# Patient Record
Sex: Male | Born: 1945
Health system: Southern US, Community
[De-identification: ages and names within clinical notes are randomized; demographics above are authoritative.]

## PROBLEM LIST (undated history)

## (undated) DIAGNOSIS — M5030 Other cervical disc degeneration, unspecified cervical region: Secondary | ICD-10-CM

## (undated) DIAGNOSIS — G4733 Obstructive sleep apnea (adult) (pediatric): Secondary | ICD-10-CM

## (undated) DIAGNOSIS — I10 Essential (primary) hypertension: Secondary | ICD-10-CM

## (undated) DIAGNOSIS — E785 Hyperlipidemia, unspecified: Secondary | ICD-10-CM

## (undated) DIAGNOSIS — E119 Type 2 diabetes mellitus without complications: Secondary | ICD-10-CM

## (undated) DIAGNOSIS — Z972 Presence of dental prosthetic device (complete) (partial): Secondary | ICD-10-CM

## (undated) DIAGNOSIS — N3941 Urge incontinence: Secondary | ICD-10-CM

## (undated) DIAGNOSIS — N429 Disorder of prostate, unspecified: Secondary | ICD-10-CM

## (undated) DIAGNOSIS — G4730 Sleep apnea, unspecified: Secondary | ICD-10-CM

## (undated) DIAGNOSIS — N138 Other obstructive and reflux uropathy: Secondary | ICD-10-CM

## (undated) DIAGNOSIS — N529 Male erectile dysfunction, unspecified: Secondary | ICD-10-CM

## (undated) DIAGNOSIS — N183 Chronic kidney disease, stage 3 unspecified: Secondary | ICD-10-CM

## (undated) DIAGNOSIS — I639 Cerebral infarction, unspecified: Secondary | ICD-10-CM

## (undated) DIAGNOSIS — K219 Gastro-esophageal reflux disease without esophagitis: Secondary | ICD-10-CM

## (undated) DIAGNOSIS — Z973 Presence of spectacles and contact lenses: Secondary | ICD-10-CM

## (undated) DIAGNOSIS — N401 Enlarged prostate with lower urinary tract symptoms: Secondary | ICD-10-CM

## (undated) DIAGNOSIS — Z794 Long term (current) use of insulin: Secondary | ICD-10-CM

## (undated) DIAGNOSIS — R972 Elevated prostate specific antigen [PSA]: Secondary | ICD-10-CM

## (undated) HISTORY — DX: Elevated prostate specific antigen (PSA): R97.20

## (undated) HISTORY — PX: SOFT TISSUE MASS EXCISION: SHX2419

## (undated) HISTORY — PX: PROSTATE BIOPSY: SHX241

## (undated) HISTORY — DX: Cerebral infarction, unspecified: I63.9

---

## 2003-04-20 ENCOUNTER — Emergency Department (HOSPITAL_COMMUNITY): Admission: EM | Admit: 2003-04-20 | Discharge: 2003-04-20 | Payer: Self-pay | Admitting: Emergency Medicine

## 2003-04-20 ENCOUNTER — Encounter: Payer: Self-pay | Admitting: Emergency Medicine

## 2006-04-15 ENCOUNTER — Encounter: Admission: RE | Admit: 2006-04-15 | Discharge: 2006-04-15 | Payer: Self-pay | Admitting: Internal Medicine

## 2010-04-07 ENCOUNTER — Emergency Department (HOSPITAL_COMMUNITY): Admission: EM | Admit: 2010-04-07 | Discharge: 2010-04-07 | Payer: Self-pay | Admitting: Emergency Medicine

## 2013-09-23 HISTORY — PX: COLONOSCOPY: SHX174

## 2017-08-26 ENCOUNTER — Emergency Department (HOSPITAL_BASED_OUTPATIENT_CLINIC_OR_DEPARTMENT_OTHER)
Admission: EM | Admit: 2017-08-26 | Discharge: 2017-08-26 | Disposition: A | Discharge status: Home / Self Care | Payer: Medicare Other | Attending: Emergency Medicine | Admitting: Emergency Medicine

## 2017-08-26 ENCOUNTER — Encounter (HOSPITAL_BASED_OUTPATIENT_CLINIC_OR_DEPARTMENT_OTHER): Payer: Self-pay | Admitting: *Deleted

## 2017-08-26 DIAGNOSIS — Z79899 Other long term (current) drug therapy: Secondary | ICD-10-CM | POA: Insufficient documentation

## 2017-08-26 DIAGNOSIS — M545 Low back pain, unspecified: Secondary | ICD-10-CM

## 2017-08-26 DIAGNOSIS — I10 Essential (primary) hypertension: Secondary | ICD-10-CM | POA: Diagnosis not present

## 2017-08-26 DIAGNOSIS — Z794 Long term (current) use of insulin: Secondary | ICD-10-CM | POA: Insufficient documentation

## 2017-08-26 DIAGNOSIS — E119 Type 2 diabetes mellitus without complications: Secondary | ICD-10-CM | POA: Insufficient documentation

## 2017-08-26 HISTORY — DX: Essential (primary) hypertension: I10

## 2017-08-26 HISTORY — DX: Gastro-esophageal reflux disease without esophagitis: K21.9

## 2017-08-26 HISTORY — DX: Sleep apnea, unspecified: G47.30

## 2017-08-26 HISTORY — DX: Disorder of prostate, unspecified: N42.9

## 2017-08-26 HISTORY — DX: Type 2 diabetes mellitus without complications: E11.9

## 2017-08-26 MED ORDER — PREDNISONE 50 MG PO TABS
60.0000 mg | ORAL_TABLET | Freq: Once | ORAL | Status: AC
  Administered 2017-08-26: 60 mg via ORAL
  Filled 2017-08-26: qty 1

## 2017-08-26 MED ORDER — ACETAMINOPHEN 500 MG PO TABS
1000.0000 mg | ORAL_TABLET | Freq: Once | ORAL | Status: AC
  Administered 2017-08-26: 1000 mg via ORAL
  Filled 2017-08-26: qty 2

## 2017-08-26 MED ORDER — PREDNISONE 20 MG PO TABS: ORAL_TABLET | ORAL | 0 refills | Status: DC

## 2017-08-26 MED ORDER — DICLOFENAC SODIUM 1 % TD GEL: TRANSDERMAL | 0 refills | Status: DC

## 2017-08-26 MED FILL — DICLOFENAC SODIUM 1% GEL: 1 | 13 days supply | Qty: 100 | Fill #0

## 2017-08-26 MED FILL — predniSONE 20 MG TABS: 20 | 4 days supply | Qty: 8 | Fill #0

## 2017-08-26 NOTE — ED Triage Notes (Signed)
Left hip pain x 1 week worse this am,  Denies inj

## 2017-08-26 NOTE — Discharge Instructions (Signed)
Take tylenol 1000mg(2 extra strength) four times a day.  ° °

## 2017-08-26 NOTE — ED Provider Notes (Signed)
[facility] EMERGENCY DEPARTMENT Provider Note   CSN: [id number] Arrival date & time: 08/26/17  [id number]     History   Chief Complaint Chief Complaint  Patient presents with  . Hip Pain    HPI Christian Mclaughlin is a 71 y.o. male.  71 yo M with a chief complaint of left-sided low back pain.  This been going on for at least a month off and on.  Patient is seen his family physician for this.  He denies loss of bowel or bladder denies lower extremity weakness or numbness.  Patient describes the pain is sharp and shooting.  His left lower and goes into the buttock.  Worse with movement and palpation twisting.  He denies trauma.  Denies fevers.  Denies recent instrumentation in his back.   The history is provided by the patient.  Back Pain   This is a new problem. The current episode started more than 1 week ago. The problem occurs constantly. The problem has been gradually worsening. The pain is associated with no known injury. The pain is present in the lumbar spine. The quality of the pain is described as stabbing and shooting. Radiates to: left buttock. The pain is at a severity of 9/10. The pain is severe. The symptoms are aggravated by bending, twisting and certain positions. The pain is the same all the time. Pertinent negatives include no chest pain, no fever, no headaches and no abdominal pain. He has tried muscle relaxants for the symptoms. The treatment provided no relief.    Past Medical History:  Diagnosis Date  . Diabetes mellitus without complication (HCC)   . GERD (gastroesophageal reflux disease)   . Hypertension   . Prostate disease   . Sleep apnea     There are no active problems to display for this patient.   History reviewed. No pertinent surgical history.     Home Medications    Prior to Admission medications   Medication Sig Start Date End Date Taking? Authorizing Provider  amlodipine-atorvastatin (CADUET) 10-10 MG tablet Take 1 tablet by mouth  daily.   Yes [provider]  aspirin 81 MG chewable tablet Chew 81 mg by mouth daily.   Yes [provider]  finasteride (PROSCAR) 5 MG tablet Take 5 mg by mouth daily.   Yes [provider]  insulin glargine (LANTUS) 100 UNIT/ML injection Inject 36 Units into the skin.   Yes [provider]  lisinopril (PRINIVIL,ZESTRIL) 40 MG tablet Take 40 mg by mouth daily.   Yes [provider]  metFORMIN (GLUCOPHAGE-XR) 500 MG 24 hr tablet Take 500 mg by mouth daily with breakfast.   Yes [provider]  pantoprazole (PROTONIX) 20 MG tablet Take 20 mg by mouth daily.   Yes [provider]  pravastatin (PRAVACHOL) 40 MG tablet Take 40 mg by mouth daily.   Yes [provider]  terazosin (HYTRIN) 5 MG capsule Take 5 mg by mouth at bedtime.   Yes [provider]  diclofenac sodium (VOLTAREN) 1 % GEL Applied to the affected area 2 times a day. 08/26/17   Melene PlanFloyd, Thalya Fouche, DO  predniSONE (DELTASONE) 20 MG tablet 2 tabs po daily x 4 days 08/26/17   Melene PlanFloyd, Kymere Fullington, DO    Family History No family history on file.  Social History Social History   Tobacco Use  . Smoking status: Never Smoker  . Smokeless tobacco: Never Used  Substance Use Topics  . Alcohol use: No    Frequency:  Never  . Drug use: Not on file     Allergies   Patient has no known allergies.   Review of Systems Review of Systems  Constitutional: Negative for chills and fever.  HENT: Negative for congestion and facial swelling.   Eyes: Negative for discharge and visual disturbance.  Respiratory: Negative for shortness of breath.   Cardiovascular: Negative for chest pain and palpitations.  Gastrointestinal: Negative for abdominal pain, diarrhea and vomiting.  Musculoskeletal: Positive for back pain and myalgias.  Skin: Negative for color change and rash.  Neurological: Negative for tremors, syncope and headaches.  Psychiatric/Behavioral: Negative for confusion and  dysphoric mood.     Physical Exam Updated Vital Signs BP (!) 162/92 (BP Location: Right Arm)   Pulse (!) 59   Temp 97.8 F (36.6 C) (Oral)   Resp 18   Ht 5\' 9"  (1.753 m)   Wt 90.7 kg (200 lb)   SpO2 100%   BMI 29.53 kg/m   Physical Exam  Constitutional: He is oriented to person, place, and time. He appears well-developed and well-nourished.  HENT:  Head: Normocephalic and atraumatic.  Eyes: EOM are normal. Pupils are equal, round, and reactive to light.  Neck: Normal range of motion. Neck supple. No JVD present.  Cardiovascular: Normal rate and regular rhythm. Exam reveals no gallop and no friction rub.  No murmur heard. Pulmonary/Chest: No respiratory distress. He has no wheezes.  Abdominal: He exhibits no distension. There is no tenderness. There is no rebound and no guarding.  Musculoskeletal: Normal range of motion. He exhibits tenderness.  Pain about the left SI joint.  Negative straight leg raise test.  Pulse motor and sensation intact distally.  Neurological: He is alert and oriented to person, place, and time.  Skin: No rash noted. No pallor.  Psychiatric: He has a normal mood and affect. His behavior is normal.  Nursing note and vitals reviewed.    ED Treatments / Results  Labs (all labs ordered are listed, but only abnormal results are displayed) Labs Reviewed - No data to display  EKG  EKG Interpretation None       Radiology No results found.  Procedures Procedures (including critical care time)  Medications Ordered in ED Medications  acetaminophen (TYLENOL) tablet 1,000 mg (not administered)  predniSONE (DELTASONE) tablet 60 mg (not administered)     Initial Impression / Assessment and Plan / ED Course  I have reviewed the triage vital signs and the nursing notes.  Pertinent labs & imaging results that were available during my care of the patient were reviewed by me and considered in my medical decision making (see chart for details).      71 yo M with a chief complaint of left-sided low back pain.  This been going on for at least the past month.  Denies trauma.  No cauda equina symptoms.  Patient is ambulatory.  He has no noted neurologic deficit.  He is recently been seen by his family physician for the same.  I do not feel that urgent imaging is warranted.  Will start on Voltaren gel.  Have him take Tylenol around-the-clock.  As this is Tuesday morning we will have him give his family physician a call and discuss his visit here and see if they want to change any other therapy.  7:31 AM:  I have discussed the diagnosis/risks/treatment options with the patient and believe the pt to be eligible for discharge home to follow-up with PCP. We also discussed returning to the  ED immediately if new or worsening sx occur. We discussed the sx which are most concerning (e.g., cauda equina signs or symptoms) that necessitate immediate return. Medications administered to the patient during their visit and any new prescriptions provided to the patient are listed below.  Medications given during this visit Medications  acetaminophen (TYLENOL) tablet 1,000 mg (not administered)  predniSONE (DELTASONE) tablet 60 mg (not administered)     The patient appears reasonably screen and/or stabilized for discharge and I doubt any other medical condition or other [facility] requiring further screening, evaluation, or treatment in the ED at this time prior to discharge.    Final Clinical Impressions(s) / ED Diagnoses   Final diagnoses:  Acute left-sided low back pain without sciatica    ED Discharge Orders        Ordered    diclofenac sodium (VOLTAREN) 1 % GEL     08/26/17 0724    predniSONE (DELTASONE) 20 MG tablet     08/26/17 0724       Melene PlanFloyd, Christianjames Soule, DO 08/26/17 [phone]

## 2018-05-31 ENCOUNTER — Other Ambulatory Visit: Payer: Self-pay

## 2018-05-31 ENCOUNTER — Inpatient Hospital Stay (HOSPITAL_COMMUNITY)
Admission: EM | Admit: 2018-05-31 | Discharge: 2018-06-02 | DRG: 066 | Disposition: A | Discharge status: Home / Self Care | Payer: Medicare Other | Attending: Family Medicine | Admitting: Family Medicine

## 2018-05-31 ENCOUNTER — Encounter (HOSPITAL_COMMUNITY): Payer: Self-pay | Admitting: Pharmacy Technician

## 2018-05-31 ENCOUNTER — Emergency Department (HOSPITAL_COMMUNITY): Payer: Medicare Other

## 2018-05-31 DIAGNOSIS — E119 Type 2 diabetes mellitus without complications: Secondary | ICD-10-CM | POA: Diagnosis not present

## 2018-05-31 DIAGNOSIS — I6381 Other cerebral infarction due to occlusion or stenosis of small artery: Principal | ICD-10-CM | POA: Diagnosis present

## 2018-05-31 DIAGNOSIS — I6302 Cerebral infarction due to thrombosis of basilar artery: Secondary | ICD-10-CM

## 2018-05-31 DIAGNOSIS — E1151 Type 2 diabetes mellitus with diabetic peripheral angiopathy without gangrene: Secondary | ICD-10-CM | POA: Diagnosis present

## 2018-05-31 DIAGNOSIS — Z7952 Long term (current) use of systemic steroids: Secondary | ICD-10-CM

## 2018-05-31 DIAGNOSIS — Z794 Long term (current) use of insulin: Secondary | ICD-10-CM

## 2018-05-31 DIAGNOSIS — R471 Dysarthria and anarthria: Secondary | ICD-10-CM | POA: Diagnosis present

## 2018-05-31 DIAGNOSIS — I6389 Other cerebral infarction: Secondary | ICD-10-CM | POA: Diagnosis not present

## 2018-05-31 DIAGNOSIS — Z79899 Other long term (current) drug therapy: Secondary | ICD-10-CM

## 2018-05-31 DIAGNOSIS — Z7982 Long term (current) use of aspirin: Secondary | ICD-10-CM | POA: Diagnosis not present

## 2018-05-31 DIAGNOSIS — R260 Ataxic gait: Secondary | ICD-10-CM | POA: Diagnosis present

## 2018-05-31 DIAGNOSIS — E785 Hyperlipidemia, unspecified: Secondary | ICD-10-CM

## 2018-05-31 DIAGNOSIS — K219 Gastro-esophageal reflux disease without esophagitis: Secondary | ICD-10-CM | POA: Diagnosis present

## 2018-05-31 DIAGNOSIS — I10 Essential (primary) hypertension: Secondary | ICD-10-CM | POA: Diagnosis not present

## 2018-05-31 DIAGNOSIS — I6359 Cerebral infarction due to unspecified occlusion or stenosis of other cerebral artery: Secondary | ICD-10-CM

## 2018-05-31 DIAGNOSIS — I639 Cerebral infarction, unspecified: Secondary | ICD-10-CM | POA: Diagnosis present

## 2018-05-31 DIAGNOSIS — E1159 Type 2 diabetes mellitus with other circulatory complications: Secondary | ICD-10-CM | POA: Diagnosis not present

## 2018-05-31 DIAGNOSIS — G4733 Obstructive sleep apnea (adult) (pediatric): Secondary | ICD-10-CM | POA: Diagnosis present

## 2018-05-31 DIAGNOSIS — R29700 NIHSS score 0: Secondary | ICD-10-CM | POA: Diagnosis present

## 2018-05-31 DIAGNOSIS — I69392 Facial weakness following cerebral infarction: Secondary | ICD-10-CM | POA: Diagnosis not present

## 2018-05-31 DIAGNOSIS — R4701 Aphasia: Secondary | ICD-10-CM | POA: Diagnosis present

## 2018-05-31 DIAGNOSIS — Z8673 Personal history of transient ischemic attack (TIA), and cerebral infarction without residual deficits: Secondary | ICD-10-CM

## 2018-05-31 HISTORY — DX: Personal history of transient ischemic attack (TIA), and cerebral infarction without residual deficits: Z86.73

## 2018-05-31 LAB — PROTIME-INR
INR: 1.05
PROTHROMBIN TIME: 13.6 s (ref 11.4–15.2)

## 2018-05-31 LAB — COMPREHENSIVE METABOLIC PANEL
ALT: 13 U/L (ref 0–44)
ANION GAP: 8 (ref 5–15)
AST: 14 U/L — ABNORMAL LOW (ref 15–41)
Albumin: 3.6 g/dL (ref 3.5–5.0)
Alkaline Phosphatase: 57 U/L (ref 38–126)
BILIRUBIN TOTAL: 0.8 mg/dL (ref 0.3–1.2)
BUN: 15 mg/dL (ref 8–23)
CO2: 25 mmol/L (ref 22–32)
CREATININE: 1.4 mg/dL — AB (ref 0.61–1.24)
Calcium: 9.1 mg/dL (ref 8.9–10.3)
Chloride: 109 mmol/L (ref 98–111)
GFR, EST AFRICAN AMERICAN: 57 mL/min — AB (ref >60)
GFR, EST NON AFRICAN AMERICAN: 49 mL/min — AB (ref >60)
Glucose, Bld: 142 mg/dL — ABNORMAL HIGH (ref 70–99)
Potassium: 3.7 mmol/L (ref 3.5–5.1)
SODIUM: 142 mmol/L (ref 135–145)
TOTAL PROTEIN: 6.4 g/dL — AB (ref 6.5–8.1)

## 2018-05-31 LAB — CBC
HEMATOCRIT: 43.3 % (ref 39.0–52.0)
Hemoglobin: 13.5 g/dL (ref 13.0–17.0)
MCH: 25.8 pg — AB (ref 26.0–34.0)
MCHC: 31.2 g/dL (ref 30.0–36.0)
MCV: 82.8 fL (ref 78.0–100.0)
Platelets: 237 10*3/uL (ref 150–400)
RBC: 5.23 MIL/uL (ref 4.22–5.81)
RDW: 13.9 % (ref 11.5–15.5)
WBC: 4.2 10*3/uL (ref 4.0–10.5)

## 2018-05-31 LAB — URINALYSIS, ROUTINE W REFLEX MICROSCOPIC
Bilirubin Urine: NEGATIVE
Glucose, UA: 150 mg/dL — AB
Hgb urine dipstick: NEGATIVE
Ketones, ur: NEGATIVE mg/dL
LEUKOCYTES UA: NEGATIVE
NITRITE: NEGATIVE
PH: 6 (ref 5.0–8.0)
Protein, ur: NEGATIVE mg/dL
SPECIFIC GRAVITY, URINE: 1.015 (ref 1.005–1.030)

## 2018-05-31 LAB — RAPID URINE DRUG SCREEN, HOSP PERFORMED
Amphetamines: NOT DETECTED
Barbiturates: NOT DETECTED
Benzodiazepines: NOT DETECTED
Cocaine: NOT DETECTED
OPIATES: NOT DETECTED
Tetrahydrocannabinol: NOT DETECTED

## 2018-05-31 LAB — CBG MONITORING, ED: GLUCOSE-CAPILLARY: 126 mg/dL — AB (ref 70–99)

## 2018-05-31 LAB — DIFFERENTIAL
Abs Immature Granulocytes: 0 10*3/uL (ref 0.0–0.1)
BASOS ABS: 0 10*3/uL (ref 0.0–0.1)
Basophils Relative: 1 %
EOS PCT: 1 %
Eosinophils Absolute: 0 10*3/uL (ref 0.0–0.7)
Immature Granulocytes: 0 %
LYMPHS ABS: 1.2 10*3/uL (ref 0.7–4.0)
LYMPHS PCT: 28 %
MONO ABS: 0.3 10*3/uL (ref 0.1–1.0)
MONOS PCT: 6 %
Neutro Abs: 2.7 10*3/uL (ref 1.7–7.7)
Neutrophils Relative %: 64 %

## 2018-05-31 LAB — GLUCOSE, CAPILLARY
GLUCOSE-CAPILLARY: 126 mg/dL — AB (ref 70–99)
Glucose-Capillary: 140 mg/dL — ABNORMAL HIGH (ref 70–99)

## 2018-05-31 LAB — I-STAT TROPONIN, ED: TROPONIN I, POC: 0.02 ng/mL (ref 0.00–0.08)

## 2018-05-31 LAB — APTT: APTT: 28 s (ref 24–36)

## 2018-05-31 LAB — HEMOGLOBIN A1C
Hgb A1c MFr Bld: 8.3 % — ABNORMAL HIGH (ref 4.8–5.6)
Mean Plasma Glucose: 191.51 mg/dL

## 2018-05-31 MED ORDER — TERAZOSIN HCL 5 MG PO CAPS
5.0000 mg | ORAL_CAPSULE | Freq: Every day | ORAL | Status: DC
  Administered 2018-05-31 – 2018-06-01 (×2): 5 mg via ORAL
  Filled 2018-05-31 (×2): qty 1

## 2018-05-31 MED ORDER — LISINOPRIL 10 MG PO TABS
10.0000 mg | ORAL_TABLET | Freq: Every day | ORAL | Status: DC
  Administered 2018-06-01 – 2018-06-02 (×2): 10 mg via ORAL
  Filled 2018-05-31 (×2): qty 1

## 2018-05-31 MED ORDER — PRAVASTATIN SODIUM 40 MG PO TABS: 40.0000 mg | ORAL_TABLET | Freq: Every day | ORAL | Status: DC

## 2018-05-31 MED ORDER — ASPIRIN 81 MG PO CHEW
81.0000 mg | CHEWABLE_TABLET | Freq: Every day | ORAL | Status: DC
  Administered 2018-06-01 – 2018-06-02 (×2): 81 mg via ORAL
  Filled 2018-05-31 (×2): qty 1

## 2018-05-31 MED ORDER — SENNOSIDES-DOCUSATE SODIUM 8.6-50 MG PO TABS
1.0000 | ORAL_TABLET | Freq: Every evening | ORAL | Status: DC | PRN
  Administered 2018-06-01: 1 via ORAL
  Filled 2018-05-31: qty 1

## 2018-05-31 MED ORDER — INSULIN ASPART 100 UNIT/ML ~~LOC~~ SOLN
0.0000 [IU] | Freq: Three times a day (TID) | SUBCUTANEOUS | Status: DC
  Administered 2018-05-31 – 2018-06-02 (×3): 2 [IU] via SUBCUTANEOUS

## 2018-05-31 MED ORDER — CLOPIDOGREL BISULFATE 75 MG PO TABS
75.0000 mg | ORAL_TABLET | Freq: Every day | ORAL | Status: DC
  Administered 2018-06-01 – 2018-06-02 (×2): 75 mg via ORAL
  Filled 2018-05-31 (×2): qty 1

## 2018-05-31 MED ORDER — SODIUM CHLORIDE 0.9 % IV SOLN
100.0000 mL/h | INTRAVENOUS | Status: DC
  Administered 2018-05-31 – 2018-06-01 (×2): 100 mL/h via INTRAVENOUS

## 2018-05-31 MED ORDER — AMLODIPINE BESYLATE 5 MG PO TABS
10.0000 mg | ORAL_TABLET | Freq: Every day | ORAL | Status: DC
  Administered 2018-06-01 – 2018-06-02 (×2): 10 mg via ORAL
  Filled 2018-05-31 (×2): qty 2

## 2018-05-31 MED ORDER — INSULIN ASPART 100 UNIT/ML ~~LOC~~ SOLN: 0.0000 [IU] | Freq: Every day | SUBCUTANEOUS | Status: DC

## 2018-05-31 MED ORDER — ACETAMINOPHEN 325 MG PO TABS: 650.0000 mg | ORAL_TABLET | ORAL | Status: DC | PRN

## 2018-05-31 MED ORDER — SODIUM CHLORIDE 0.9 % IV BOLUS
500.0000 mL | Freq: Once | INTRAVENOUS | Status: AC
  Administered 2018-05-31: 500 mL via INTRAVENOUS

## 2018-05-31 MED ORDER — STROKE: EARLY STAGES OF RECOVERY BOOK
Freq: Once | Status: AC
  Administered 2018-05-31: 17:00:00
  Filled 2018-05-31 (×2): qty 1

## 2018-05-31 MED ORDER — ENOXAPARIN SODIUM 40 MG/0.4ML ~~LOC~~ SOLN
40.0000 mg | SUBCUTANEOUS | Status: DC
  Administered 2018-05-31: 40 mg via SUBCUTANEOUS
  Filled 2018-05-31: qty 0.4

## 2018-05-31 MED ORDER — PANTOPRAZOLE SODIUM 20 MG PO TBEC
20.0000 mg | DELAYED_RELEASE_TABLET | Freq: Every day | ORAL | Status: DC
  Administered 2018-06-01 – 2018-06-02 (×2): 20 mg via ORAL
  Filled 2018-05-31 (×3): qty 1

## 2018-05-31 MED ORDER — ACETAMINOPHEN 650 MG RE SUPP: 650.0000 mg | RECTAL | Status: DC | PRN

## 2018-05-31 MED ORDER — ACETAMINOPHEN 160 MG/5ML PO SOLN: 650.0000 mg | ORAL | Status: DC | PRN

## 2018-05-31 MED ORDER — FINASTERIDE 5 MG PO TABS
5.0000 mg | ORAL_TABLET | Freq: Every day | ORAL | Status: DC
  Administered 2018-06-01 – 2018-06-02 (×2): 5 mg via ORAL
  Filled 2018-05-31 (×2): qty 1

## 2018-05-31 MED ORDER — ACETAMINOPHEN 500 MG PO TABS: 1000.0000 mg | ORAL_TABLET | Freq: Four times a day (QID) | ORAL | Status: DC | PRN

## 2018-05-31 NOTE — ED Notes (Signed)
Pt remains in MRI, no needs from wife at bedside.

## 2018-05-31 NOTE — H&P (Signed)
History and Physical    Christian Mclaughlin TKW:[id number] DOB: November 16, 1945 DOA: 05/31/2018  Referring MD/NP/PA:  PCP: Barbie Banner, MD  Outpatient Specialists:  Patient coming from: home  Chief Complaint: lightheadedness  HPI: Christian Mclaughlin is a 72 y.o. male with medical history significant for but not limited to type 2 diabetes and hypertension presenting today with one day h/o complaint of ataxia, lightheadedness with speech difficulties.he denies any exudate to weakness or any specific headaches, no nausea vomiting, no difficulty swallowing.  ED Course: at the emergency room patient was noted to be hemodynamically stable, with mild aphasia and ataxia. MRI indicated acute stroke. Neurology was consulted hospital medicine as tablet for admission.  Review of Systems: As per HPI otherwise 10 point review of systems negative.    Past Medical History:  Diagnosis Date  . Diabetes mellitus without complication (HCC)   . GERD (gastroesophageal reflux disease)   . Hypertension   . Prostate disease   . Sleep apnea     History reviewed. No pertinent surgical history.   reports that he has never smoked. He has never used smokeless tobacco. He reports that he does not drink alcohol. His drug history is not on file.  No Known Allergies  No family history on file.   Prior to Admission medications   Medication Sig Start Date End Date Taking? Authorizing Provider  acetaminophen (TYLENOL) 500 MG tablet Take 1,000 mg by mouth every 6 (six) hours as needed for mild pain or headache.   Yes [provider]  amLODipine (NORVASC) 10 MG tablet Take 10 mg by mouth daily. 04/25/18  Yes [provider]  aspirin 81 MG chewable tablet Chew 81 mg by mouth daily.   Yes [provider]  diclofenac sodium (VOLTAREN) 1 % GEL Applied to the affected area 2 times a day. Patient taking differently: Apply 2 g topically 2 (two) times daily as needed (pain and inflammation). Applied to  the affected area 2 times a day. 08/26/17  Yes Melene Plan, DO  finasteride (PROSCAR) 5 MG tablet Take 5 mg by mouth daily.   Yes [provider]  insulin glargine (LANTUS) 100 UNIT/ML injection Inject 36 Units into the skin.   Yes [provider]  lisinopril (PRINIVIL,ZESTRIL) 10 MG tablet Take 10 mg by mouth daily.    Yes [provider]  metFORMIN (GLUCOPHAGE-XR) 500 MG 24 hr tablet Take 500 mg by mouth daily with breakfast.   Yes [provider]  pantoprazole (PROTONIX) 20 MG tablet Take 20 mg by mouth daily.   Yes [provider]  pravastatin (PRAVACHOL) 40 MG tablet Take 40 mg by mouth daily.   Yes [provider]  terazosin (HYTRIN) 5 MG capsule Take 5 mg by mouth at bedtime.   Yes [provider]  amlodipine-atorvastatin (CADUET) 10-10 MG tablet Take 1 tablet by mouth daily.    [provider]  predniSONE (DELTASONE) 20 MG tablet 2 tabs po daily x 4 days Patient not taking: Reported on 05/31/2018 08/26/17   Melene Plan, DO    Physical Exam: Vitals:   05/31/18 1053 05/31/18 1100 05/31/18 1130 05/31/18 1330  BP: 134/70 127/70 126/74 (!) 144/79  Pulse: (!) 49 (!) 53 (!) 50 (!) 59  Resp: 15 14 15 14   Temp: 97.8 F (36.6 C)     TempSrc: Oral     SpO2: 98% 100% 98% 97%      Constitutional: NAD, calm, comfortable Vitals:   05/31/18 1053 05/31/18 1100  05/31/18 1130 05/31/18 1330  BP: 134/70 127/70 126/74 (!) 144/79  Pulse: (!) 49 (!) 53 (!) 50 (!) 59  Resp: 15 14 15 14   Temp: 97.8 F (36.6 C)     TempSrc: Oral     SpO2: 98% 100% 98% 97%   Eyes: PERRL, lids and conjunctivae normal ENMT: Mucous membranes are moist. Posterior pharynx clear of any exudate or lesions.Normal dentition.  Neck: normal, supple, no masses, no thyromegaly Respiratory: clear to auscultation bilaterally, no wheezing, no crackles. Normal respiratory effort. No accessory muscle use.  Cardiovascular: Regular rate and rhythm, no murmurs / rubs  / gallops. No extremity edema. 2+ pedal pulses. No carotid bruits.  Abdomen: no tenderness, no masses palpated. No hepatosplenomegaly. Bowel sounds positive.  Musculoskeletal: no clubbing / cyanosis. No joint deformity upper and lower extremities. Good ROM, no contractures. Normal muscle tone.  Skin: no rashes, lesions, ulcers. No induration Neurologic: Alert, oriented 3, power intact mild positive incoordination with finger-to-nose testing  Psychiatric: Normal judgment and insight. Alert and oriented x 3. Normal mood.     Labs on Admission: I have personally reviewed following labs and imaging studies  CBC: Recent Labs  Lab 05/31/18 1114  WBC 4.2  NEUTROABS 2.7  HGB 13.5  HCT 43.3  MCV 82.8  PLT 237   Basic Metabolic Panel: Recent Labs  Lab 05/31/18 1114  NA 142  K 3.7  CL 109  CO2 25  GLUCOSE 142*  BUN 15  CREATININE 1.40*  CALCIUM 9.1   GFR: CrCl cannot be calculated (Unknown ideal weight.). Liver Function Tests: Recent Labs  Lab 05/31/18 1114  AST 14*  ALT 13  ALKPHOS 57  BILITOT 0.8  PROT 6.4*  ALBUMIN 3.6   No results for input(s): LIPASE, AMYLASE in the last 168 hours. No results for input(s): AMMONIA in the last 168 hours. Coagulation Profile: Recent Labs  Lab 05/31/18 1114  INR 1.05   Cardiac Enzymes: No results for input(s): CKTOTAL, CKMB, CKMBINDEX, TROPONINI in the last 168 hours. BNP (last 3 results) No results for input(s): PROBNP in the last 8760 hours. HbA1C: No results for input(s): HGBA1C in the last 72 hours. CBG: Recent Labs  Lab 05/31/18 1050  GLUCAP 126*   Lipid Profile: No results for input(s): CHOL, HDL, LDLCALC, TRIG, CHOLHDL, LDLDIRECT in the last 72 hours. Thyroid Function Tests: No results for input(s): TSH, T4TOTAL, FREET4, T3FREE, THYROIDAB in the last 72 hours. Anemia Panel: No results for input(s): VITAMINB12, FOLATE, FERRITIN, TIBC, IRON, RETICCTPCT in the last 72 hours. Urine analysis: No results found for:  COLORURINE, APPEARANCEUR, LABSPEC, PHURINE, GLUCOSEU, HGBUR, BILIRUBINUR, KETONESUR, PROTEINUR, UROBILINOGEN, NITRITE, LEUKOCYTESUR Sepsis Labs: @LABRCNTIP (procalcitonin:4,lacticidven:4) )No results found for this or any previous visit (from the past 240 hour(s)).   Radiological Exams on Admission: Mr Brain Wo Contrast  Result Date: 05/31/2018 CLINICAL DATA:  Ataxia. Stroke suspected. Focal neuro deficit of greater than 6 hours. Stroke suspected. Generalized weakness and dizziness. EXAM: MRI HEAD WITHOUT CONTRAST TECHNIQUE: Multiplanar, multiecho pulse sequences of the brain and surrounding structures were obtained without intravenous contrast. COMPARISON:  None. FINDINGS: Brain: Acute nonhemorrhagic left paramedian pontine infarct measures 18 mm anterior-posterior is subtle T2 signal changes are associated with the acute infarct. There is a remote lacunar infarct involving the left internal capsule. Minimal scattered subcortical T2 hyperintensities otherwise are within normal limits for age. No acute hemorrhage or mass lesion is present. The internal auditory canals are within normal limits bilaterally. Cerebellum is within normal limits. Ventricles are proportionate to  the degree of atrophy. No significant extra-axial fluid collection is present. Vascular: Flow is present in the major intracranial arteries. Skull and upper cervical spine: The skull base is within normal limits. Craniocervical junction is normal. Remote endplate changes and loss of height is present at C3 and C4. Spinal canal is patent. Sinuses/Orbits: Mild mucosal thickening is present throughout the ethmoid air cells. Paranasal sinuses and mastoid air cells are otherwise clear. Globes and orbits are within normal limits bilaterally. IMPRESSION: 1. Acute nonhemorrhagic left paramedian pontine infarct. 2. Remote punctate lacunar infarct of the left internal capsule. 3. Degenerative changes of the upper cervical spine chronic endplate changes  and some loss of height. These results were called by telephone at the time of interpretation on 05/31/2018 at 12:47 pm to Dr. Gerhard Munch , who verbally acknowledged these results. Electronically Signed   By: Marin Roberts M.D.   On: 05/31/2018 12:51    EKG: Independently reviewed.   Assessment/Plan Principal Problem:   CVA (cerebral vascular accident) (HCC) Active Problems:   Hypertension   DM2 (diabetes mellitus, type 2) (HCC)   Hyperlipidemia  1. Acute stroke: Presents with mild ataxia/aphasia MRI 05/31/2018-Acute left paramedian pontine infarct, with remote punctate lacunar infarct of the left internal capsule. Lipid, A1c 2-D echo Carotid Doppler Swallow eval PT/OT eval when appropriate Neurology consulted-flow recommendations  2. Type 2 diabetes: Sliding-scale insulin for now  High risk for hypoglycemia until cleared for meals - resume home medications as needed   3. Hyperlipidemia: Check lipid levels LDL goal less than 70 - adjust statin as needed  4. Hypertension: Optimize BP control as needed     DVT prophylaxis:  (Lovenox) Code Status:  (Full) Family Communication:  Disposition Plan: TBD) Consults called: Neurology (Dr Uvaldo Rising, per EDP) Admission status: (inpatient / tele )   Jackie Plum MD [facility] Pager [phone]  If 7PM-7AM, please contact night-coverage [email or web address] Password [email or web address]  05/31/2018, 2:12 PM

## 2018-05-31 NOTE — ED Notes (Addendum)
Pt states speech is better now. Pt states  He woke up at 0730 today and "didn't feel right".

## 2018-05-31 NOTE — ED Provider Notes (Signed)
[facility] EMERGENCY DEPARTMENT Provider Note   CSN: [id number] Arrival date & time: 05/31/18  1036     History   Chief Complaint Chief Complaint  Patient presents with  . Stroke Symptoms    HPI Christian Mclaughlin is a 72 y.o. male.  HPI Patient presents with concern of ataxia, lightheadedness. Patient has multiple medical issues, but states that he was well until 4 days ago. During the day, he suddenly felt onset of dizziness, speech slowing, and ataxia. Symptoms have been persistent since that time, more prominent with activity. No pain, no complete syncope, no hemiplegia, but with persistent sensation of neurologic deficiency he presents for evaluation.  Past Medical History:  Diagnosis Date  . Diabetes mellitus without complication (HCC)   . GERD (gastroesophageal reflux disease)   . Hypertension   . Prostate disease   . Sleep apnea     There are no active problems to display for this patient.   History reviewed. No pertinent surgical history.      Home Medications    Prior to Admission medications   Medication Sig Start Date End Date Taking? Authorizing Provider  acetaminophen (TYLENOL) 500 MG tablet Take 1,000 mg by mouth every 6 (six) hours as needed for mild pain or headache.   Yes [provider]  amLODipine (NORVASC) 10 MG tablet Take 10 mg by mouth daily. 04/25/18  Yes [provider]  aspirin 81 MG chewable tablet Chew 81 mg by mouth daily.   Yes [provider]  diclofenac sodium (VOLTAREN) 1 % GEL Applied to the affected area 2 times a day. Patient taking differently: Apply 2 g topically 2 (two) times daily as needed (pain and inflammation). Applied to the affected area 2 times a day. 08/26/17  Yes Melene Plan, DO  finasteride (PROSCAR) 5 MG tablet Take 5 mg by mouth daily.   Yes [provider]  insulin glargine (LANTUS) 100 UNIT/ML injection Inject 36 Units into the skin.   Yes [provider]  lisinopril (PRINIVIL,ZESTRIL) 10 MG tablet Take 10 mg by mouth daily.    Yes [provider]  metFORMIN (GLUCOPHAGE-XR) 500 MG 24 hr tablet Take 500 mg by mouth daily with breakfast.   Yes [provider]  pantoprazole (PROTONIX) 20 MG tablet Take 20 mg by mouth daily.   Yes [provider]  pravastatin (PRAVACHOL) 40 MG tablet Take 40 mg by mouth daily.   Yes [provider]  terazosin (HYTRIN) 5 MG capsule Take 5 mg by mouth at bedtime.   Yes [provider]  amlodipine-atorvastatin (CADUET) 10-10 MG tablet Take 1 tablet by mouth daily.    [provider]  predniSONE (DELTASONE) 20 MG tablet 2 tabs po daily x 4 days Patient not taking: Reported on 05/31/2018 08/26/17   Melene Plan, DO    Family History No family history on file.  Social History Social History   Tobacco Use  . Smoking status: Never Smoker  . Smokeless tobacco: Never Used  Substance Use Topics  . Alcohol use: No    Frequency: Never  . Drug use: Not on file     Allergies   Patient has no known allergies.   Review of Systems Review of Systems  Constitutional:       Per HPI, otherwise negative  HENT:       Per HPI, otherwise negative  Respiratory:       Per HPI, otherwise negative  Cardiovascular:  Per HPI, otherwise negative  Gastrointestinal: Negative for vomiting.  Endocrine:       Negative aside from HPI  Genitourinary:       Neg aside from HPI   Musculoskeletal:       Per HPI, otherwise negative  Skin: Negative.   Neurological: Positive for speech difficulty, weakness and light-headedness. Negative for syncope.     Physical Exam Updated Vital Signs BP (!) 144/79   Pulse (!) 59   Temp 97.8 F (36.6 C) (Oral)   Resp 14   SpO2 97%   Physical Exam  Constitutional: He is oriented to person, place, and time. He appears well-developed. No distress.  HENT:  Head: Normocephalic and atraumatic.  Eyes: Conjunctivae and EOM are normal.    Cardiovascular: Normal rate and regular rhythm.  Pulmonary/Chest: Effort normal. No stridor. No respiratory distress.  Abdominal: He exhibits no distension.  Musculoskeletal: He exhibits no edema.  Neurological: He is alert and oriented to person, place, and time. He displays no tremor. No cranial nerve deficit. He displays no seizure activity. Coordination (No gross dysmetria, though slow finger nose testing) abnormal.  Skin: Skin is warm and dry.  Psychiatric: He has a normal mood and affect.  Nursing note and vitals reviewed.    ED Treatments / Results  Labs (all labs ordered are listed, but only abnormal results are displayed) Labs Reviewed  CBC - Abnormal; Notable for the following components:      Result Value   MCH 25.8 (*)    All other components within normal limits  COMPREHENSIVE METABOLIC PANEL - Abnormal; Notable for the following components:   Glucose, Bld 142 (*)    Creatinine, Ser 1.40 (*)    Total Protein 6.4 (*)    AST 14 (*)    GFR calc non Af Amer 49 (*)    GFR calc Af Amer 57 (*)    All other components within normal limits  CBG MONITORING, ED - Abnormal; Notable for the following components:   Glucose-Capillary 126 (*)    All other components within normal limits  PROTIME-INR  APTT  DIFFERENTIAL  RAPID URINE DRUG SCREEN, HOSP PERFORMED  URINALYSIS, ROUTINE W REFLEX MICROSCOPIC  I-STAT TROPONIN, ED    EKG EKG Interpretation  Date/Time:  Sunday May 31 2018 10:43:31 EDT Ventricular Rate:  49 PR Interval:    QRS Duration: 93 QT Interval:  441 QTC Calculation: 399 R Axis:   54 Text Interpretation:  Sinus bradycardia ST-t wave abnormality Baseline wander Abnormal ekg Confirmed by Gerhard Munch [phone]) on 05/31/2018 10:58:13 AM Also confirmed by Gerhard Munch ([id number]), editor Barbette Hair [phone])  on 05/31/2018 12:37:36 PM   Radiology Mr Brain Wo Contrast  Result Date: 05/31/2018 CLINICAL DATA:  Ataxia. Stroke suspected. Focal neuro deficit  of greater than 6 hours. Stroke suspected. Generalized weakness and dizziness. EXAM: MRI HEAD WITHOUT CONTRAST TECHNIQUE: Multiplanar, multiecho pulse sequences of the brain and surrounding structures were obtained without intravenous contrast. COMPARISON:  None. FINDINGS: Brain: Acute nonhemorrhagic left paramedian pontine infarct measures 18 mm anterior-posterior is subtle T2 signal changes are associated with the acute infarct. There is a remote lacunar infarct involving the left internal capsule. Minimal scattered subcortical T2 hyperintensities otherwise are within normal limits for age. No acute hemorrhage or mass lesion is present. The internal auditory canals are within normal limits bilaterally. Cerebellum is within normal limits. Ventricles are proportionate to the degree of atrophy. No significant extra-axial fluid collection is present. Vascular: Flow is present in the  major intracranial arteries. Skull and upper cervical spine: The skull base is within normal limits. Craniocervical junction is normal. Remote endplate changes and loss of height is present at C3 and C4. Spinal canal is patent. Sinuses/Orbits: Mild mucosal thickening is present throughout the ethmoid air cells. Paranasal sinuses and mastoid air cells are otherwise clear. Globes and orbits are within normal limits bilaterally. IMPRESSION: 1. Acute nonhemorrhagic left paramedian pontine infarct. 2. Remote punctate lacunar infarct of the left internal capsule. 3. Degenerative changes of the upper cervical spine chronic endplate changes and some loss of height. These results were called by telephone at the time of interpretation on 05/31/2018 at 12:47 pm to Dr. Gerhard Munch , who verbally acknowledged these results. Electronically Signed   By: Marin Roberts M.D.   On: 05/31/2018 12:51    Procedures Procedures (including critical care time)  Medications Ordered in ED Medications  sodium chloride 0.9 % bolus 500 mL (500 mLs  Intravenous New Bag/Given 05/31/18 1323)    Followed by  0.9 %  sodium chloride infusion (100 mL/hr Intravenous New Bag/Given 05/31/18 1345)     Initial Impression / Assessment and Plan / ED Course  I have reviewed the triage vital signs and the nursing notes.  Pertinent labs & imaging results that were available during my care of the patient were reviewed by me and considered in my medical decision making (see chart for details).     1:47 PM On repeat exam the patient is in similar condition, now company by his wife. We discussed the findings, including concern for new brainstem infarct. We discussed applications of this, need for admission for further evaluation, management, consideration of echocardiogram, carotid Doppler ultrasound, and medication review. I discussed his case with our neurology colleagues, who will follow as a consulting team.  This 72 year old male presents with new mild aphasia, mild ataxia, is found to have MRI evidence of new stroke. Patient is hemodynamically unremarkable, has a patent airway, was admitted for further evaluation and management  Final Clinical Impressions(s) / ED Diagnoses  Acute stroke   Gerhard Munch, MD 05/31/18 1348

## 2018-05-31 NOTE — Progress Notes (Signed)
Received patient from ED about 1430. Patient admitted to hospital in ED at 10:30 am 05/31/18, Q2H neuro and NIH/Mod Nih till 10:30 pm 05/31/18. Pattient not experinncingng any pain,symptoms

## 2018-05-31 NOTE — Consult Note (Addendum)
NEURO HOSPITALIST      Requesting Physician: Dr. Jeraldine Loots    Chief Complaint: ataxia, light headedness  History obtained from:  Patient     HPI:                                                                                                                                         DEMITRIOS Mclaughlin is an 72 y.o. male  With PMH of HTN, DM who presented to [facility] ED with a  4 day history of ataxia and being light headed.    Patient states that 4 days ago (05/27/18- Wednesday) he had a sudden on set of being light headed. He was sitting in a chair and then stood up and felt light headed/ dizzy. He then fell to his knees, but denies a LOC or hitting his head. The episode passed, and then he had another onset of dizziness on Thursday. No complications at all on Friday, and then again on Saturday. This morning it happened again while he was driving to get biscuits from [place]. While driving he noticed that he appeared to be swerving some  As if he " kept over correcting his steering". While in [place] he began stuttering. He continued on and returned home with his biscuits and EMS was called. He did have some slurred speech today with this event. BG was 64 this morning during the episode but was 164 on wednesday when it first started happening. He states that he had a light HA since this all happened. Denies any drug abuse, current smoking history ( he quit over 10 years ago),  Numbness, tingling, focal weakness, vision problems, or facial droop. Endorses taking ASA 81 mg daily and denies missing any doses, does drink socially. Denies any anticoagulation.     ED course:  BG: 142, BP: 155/79 MRI Brain: revealed a non hemorrhagic left paramedian pontine infarct, remote punctate lacunar infarct of the left internal capsule.  No prior stroke history noted.   Date last known well: Date: 05/27/2018 Time last known well: Unable to determine tPA Given:  No: contraindicated; outside of window Modified Rankin: Rankin Score=0 NIHSS: 0 1a Level of Conscious:0 1b LOC Questions: 0 1c LOC Commands: 0 2 Best Gaze: 0 3 Visual: 0 4 Facial Palsy: 0 5a Motor Arm - left: 0 5b Motor Arm - Right: 0 6a Motor Leg - Left: 0 6b Motor Leg - Right: 0 7 Limb Ataxia: 0 8 Sensory: 0 9 Best Language: 0 10 Dysarthria:0 11 Extinct. and Inattention:0 TOTAL: 0     Past Medical History:  Diagnosis Date  . Diabetes mellitus without complication (HCC)   .  GERD (gastroesophageal reflux disease)   . Hypertension   . Prostate disease   . Sleep apnea     History reviewed. No pertinent surgical history.  No family history on file.       Social History:  reports that he has never smoked. He has never used smokeless tobacco. He reports that he does not drink alcohol. His drug history is not on file.  Allergies: No Known Allergies  Medications:                                                                                                                           Scheduled: .  stroke: mapping our early stages of recovery book   Does not apply Once  . amLODipine  10 mg Oral Daily  . aspirin  81 mg Oral Daily  . enoxaparin (LOVENOX) injection  40 mg Subcutaneous Q24H  . finasteride  5 mg Oral Daily  . insulin aspart  0-15 Units Subcutaneous TID WC  . insulin aspart  0-5 Units Subcutaneous QHS  . lisinopril  10 mg Oral Daily  . pantoprazole  20 mg Oral Daily  . pravastatin  40 mg Oral Daily  . terazosin  5 mg Oral QHS   Continuous: . sodium chloride 100 mL/hr (05/31/18 1345)   [id number] **OR** acetaminophen (TYLENOL) oral liquid 160 mg/5 mL **OR** acetaminophen, acetaminophen, senna-docusate   ROS:                                                                                                                                       History obtained from the patient  General ROS: negative for - chills, fatigue, fever, night sweats, weight  gain or weight loss Psychological ROS: negative for - , hallucinations, memory difficulties, mood swings or  Ophthalmic ROS: negative for - blurry vision, double vision, eye pain or loss of vision ENT ROS: negative for - epistaxis, nasal discharge, oral lesions, sore throat, tinnitus or vertigo Respiratory ROS: negative for - cough,  shortness of breath or wheezing Cardiovascular ROS: negative for - chest pain, dyspnea on exertion,  Gastrointestinal ROS: negative for - abdominal pain, diarrhea,  nausea/vomiting or stool incontinence Genito-Urinary ROS: negative for - dysuria, hematuria, incontinence or urinary frequency/urgency Musculoskeletal ROS: negative for - joint swelling or muscular weakness Neurological ROS: as noted in HPI   General Examination:  Blood pressure 126/74, pulse (!) 50, temperature 97.8 F (36.6 C), temperature source Oral, resp. rate 15, SpO2 98 %.  HEENT-  Normocephalic, no lesions, without obvious abnormality.  Normal external eye and conjunctiva.  Cardiovascular- S1-S2 audible, pulses palpable throughout   Lungs-no rhonchi or wheezing noted, no excessive working breathing.  Saturations within normal limits on RA Abdomen- All 4 quadrants palpated and non tender and BS present in all 4 quadrants Extremities- Warm, dry and intact Musculoskeletal-no joint tenderness, deformity or swelling Skin-warm and dry, no hyperpigmentation, vitiligo, or suspicious lesions  Neurological Examination Mental Status: Alert, oriented, thought content appropriate.  Speech fluent without evidence of aphasia.  Able to follow commands without difficulty. Cranial Nerves: II:  Visual fields grossly normal,  III,IV, VI: ptosis not present, extra-ocular motions intact bilaterally, pupils equal, round, reactive to light and accommodation V,VII: smile symmetric, facial light touch sensation  normal bilaterally VIII: hearing normal bilaterally IX,X: uvula rises symmetrically XI: bilateral shoulder shrug XII: midline tongue extension Motor: Right : Upper extremity   5/5    Left:     Upper extremity   5/5  Lower extremity   5/5     Lower extremity   5/5 Tone and bulk:normal tone throughout; no atrophy noted Sensory: light touch intact throughout, bilaterally Deep Tendon Reflexes: 2+ and symmetric biceps and patella Plantars: Right: downgoing   Left: downgoing Cerebellar: normal finger-to-nose, normal rapid alternating movements and normal heel-to-shin test Gait: deferred   Lab Results: Basic Metabolic Panel: Recent Labs  Lab 05/31/18 1114  NA 142  K 3.7  CL 109  CO2 25  GLUCOSE 142*  BUN 15  CREATININE 1.40*  CALCIUM 9.1    CBC: Recent Labs  Lab 05/31/18 1114  WBC 4.2  NEUTROABS 2.7  HGB 13.5  HCT 43.3  MCV 82.8  PLT 237    Lipid Panel: No results for input(s): CHOL, TRIG, HDL, CHOLHDL, VLDL, LDLCALC in the last 168 hours.  CBG: Recent Labs  Lab 05/31/18 1050  GLUCAP 126*    Imaging: Mr Brain Wo Contrast  Result Date: 05/31/2018 CLINICAL DATA:  Ataxia. Stroke suspected. Focal neuro deficit of greater than 6 hours. Stroke suspected. Generalized weakness and dizziness. EXAM: MRI HEAD WITHOUT CONTRAST TECHNIQUE: Multiplanar, multiecho pulse sequences of the brain and surrounding structures were obtained without intravenous contrast. COMPARISON:  None. FINDINGS: Brain: Acute nonhemorrhagic left paramedian pontine infarct measures 18 mm anterior-posterior is subtle T2 signal changes are associated with the acute infarct. There is a remote lacunar infarct involving the left internal capsule. Minimal scattered subcortical T2 hyperintensities otherwise are within normal limits for age. No acute hemorrhage or mass lesion is present. The internal auditory canals are within normal limits bilaterally. Cerebellum is within normal limits. Ventricles are  proportionate to the degree of atrophy. No significant extra-axial fluid collection is present. Vascular: Flow is present in the major intracranial arteries. Skull and upper cervical spine: The skull base is within normal limits. Craniocervical junction is normal. Remote endplate changes and loss of height is present at C3 and C4. Spinal canal is patent. Sinuses/Orbits: Mild mucosal thickening is present throughout the ethmoid air cells. Paranasal sinuses and mastoid air cells are otherwise clear. Globes and orbits are within normal limits bilaterally. IMPRESSION: 1. Acute nonhemorrhagic left paramedian pontine infarct. 2. Remote punctate lacunar infarct of the left internal capsule. 3. Degenerative changes of the upper cervical spine chronic endplate changes and some loss of height. These results were called by telephone at the time of  interpretation on 05/31/2018 at 12:47 pm to Dr. Gerhard Munch , who verbally acknowledged these results. Electronically Signed   By: Marin Roberts M.D.   On: 05/31/2018 12:51       Valentina Lucks, MSN, NP-C [facility] [phone]  05/31/2018, 1:29 PM   Attending physician note to follow with Assessment and plan .  I have seen the patient and reviewed the note.   Assessment: 72 y.o. male  With PMH of HTN, DM who presented to [facility] ED with a  4 day history of ataxia and  lightheadedness. 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    Recommendations: -- BP goal : Normotensive --MRI Brain ( completed) --MRA of the head w/o and neck with contrast --Echocardiogram -- ASA  -- High intensity Statin if LDL > 70  -- HgbA1c, fasting lipid panel -- PT consult, OT consult, Speech consult --Telemetry monitoring --Frequent neuro checks --Stroke swallow screen   --please page stroke NP  Or  PA  Or MD from 8am -4 pm  as this  patient from this time will be  followed by the stroke.   You can look them up on [email or web address]  Password [email or web address]  Ritta Slot, MD [facility] [phone]  If 7pm- 7am, please page neurology on call as listed in AMION.

## 2018-05-31 NOTE — ED Notes (Signed)
Neuro at bedside.

## 2018-05-31 NOTE — Evaluation (Signed)
Occupational Therapy Evaluation Patient Details Name: Christian Mclaughlin MRN: [id number] DOB: [birth year] Today's Date: 05/31/2018    History of Present Illness This 72 y.o. male admitted with one day h/o ataxia, light headedness, and speech difficulty.  MRI of brain showed:  Acute nonhemorrhagic left paramedian pontine infarct.   Clinical Impression   Pt admitted with above. He demonstrates the below listed deficits and will benefit from continued OT to maximize safety and independence with BADLs.  Pt presents to OT with mildly impaired balance.   He requires min guard to min A for ADLs and functional mobility.  He lives with wife and adult daughter, who is autistic.   He was independent with all ADLs, IADLs, including driving, PTA.   He is retired from the [employer].  Will follow acutely.  No follow up OT recommended at discharge.       Follow Up Recommendations  No OT follow up;Supervision - Intermittent    Equipment Recommendations  Tub/shower seat    Recommendations for Other Services       Precautions / Restrictions Precautions Precautions: Fall Precaution Comments: Pt reports he has fall x1 in past week, and had several losses of balance       Mobility Bed Mobility                  Transfers Overall transfer level: Needs assistance Equipment used: None Transfers: Sit to/from Stand;Stand Pivot Transfers Sit to Stand: Min guard Stand pivot transfers: Min guard       General transfer comment: min guard for balance     Balance Overall balance assessment: Needs assistance Sitting-balance support: Feet supported Sitting balance-Leahy Scale: Good     Standing balance support: No upper extremity supported Standing balance-Leahy Scale: Fair                             ADL either performed or assessed with clinical judgement   ADL Overall ADL's : Needs assistance/impaired Eating/Feeding: Independent   Grooming: Wash/dry  hands;Wash/dry face;Oral care;Min guard;Standing   Upper Body Bathing: Set up;Sitting   Lower Body Bathing: Min guard;Sit to/from stand   Upper Body Dressing : Set up;Sitting   Lower Body Dressing: Min guard;Sit to/from stand   Toilet Transfer: Min guard;Ambulation;Regular Toilet;Grab bars   Toileting- Clothing Manipulation and Hygiene: Min guard;Sit to/from stand       Functional mobility during ADLs: Min guard;Minimal assistance General ADL Comments: Pt requires assist due to balance deficits      Vision Baseline Vision/History: Wears glasses Wears Glasses: Reading only Patient Visual Report: No change from baseline Vision Assessment?: Yes Eye Alignment: Within Functional Limits Ocular Range of Motion: Within Functional Limits Alignment/Gaze Preference: Within Defined Limits Tracking/Visual Pursuits: Able to track stimulus in all quads without difficulty Saccades: Within functional limits Convergence: Within functional limits Visual Fields: No apparent deficits     Development worker, international aid Tested?: Yes   Praxis Praxis Praxis tested?: Within functional limits    Pertinent Vitals/Pain Pain Assessment: No/denies pain     Hand Dominance Right   Extremity/Trunk Assessment Upper Extremity Assessment Upper Extremity Assessment: Overall WFL for tasks assessed   Lower Extremity Assessment Lower Extremity Assessment: Defer to PT evaluation   Cervical / Trunk Assessment Cervical / Trunk Assessment: Normal   Communication Communication Communication: No difficulties   Cognition Arousal/Alertness: Awake/alert Behavior During Therapy: WFL for tasks assessed/performed Overall Cognitive Status: Within Functional Limits for tasks assessed  General Comments  while ambulating in hallway, he required up to min A for balance, as he lost balance when attempting to perform serial counting by 2s    Exercises      Shoulder Instructions      Home Living Family/patient expects to be discharged to:: Private residence Living Arrangements: Spouse/significant other;Children Available Help at Discharge: Family;Available 24 hours/day Type of Home: House Home Access: Stairs to enter [employer] of Steps: 1 Entrance Stairs-Rails: None Home Layout: One level     Bathroom Shower/Tub: Producer, television/film/video: Handicapped height     Home Equipment: None   Additional Comments: Pt lives with spouse and adult daughter who has autism       Prior Functioning/Environment Level of Independence: Independent        Comments: Pt is independent with driving and IADLs including yard work.  He is retired from [employer] Problem List: Impaired balance (sitting and/or standing)      OT Treatment/Interventions: Self-care/ADL training;Neuromuscular education;DME and/or AE instruction;Therapeutic activities;Patient/family education;Balance training    OT Goals(Current goals can be found in the care plan section) Acute Rehab OT Goals Patient Stated Goal: to get back to normal  OT Goal Formulation: With patient Time For Goal Achievement: 06/14/18 Potential to Achieve Goals: Good ADL Goals Pt Will Perform Grooming: with modified independence;standing Pt Will Perform Upper Body Bathing: with modified independence;standing Pt Will Perform Lower Body Bathing: with modified independence;sit to/from stand Pt Will Perform Upper Body Dressing: with modified independence;sitting Pt Will Perform Lower Body Dressing: with modified independence;sit to/from stand Pt Will Transfer to Toilet: with modified independence;ambulating;regular height toilet Pt Will Perform Toileting - Clothing Manipulation and hygiene: with modified independence;sit to/from stand Pt Will Perform Tub/Shower Transfer: Shower transfer;ambulating  OT Frequency: Min 2X/week   Barriers to D/C:             Co-evaluation              AM-PAC PT "6 Clicks" Daily Activity     Outcome Measure Help from another person eating meals?: None Help from another person taking care of personal grooming?: A Little Help from another person toileting, which includes using toliet, bedpan, or urinal?: A Little Help from another person bathing (including washing, rinsing, drying)?: A Little Help from another person to put on and taking off regular upper body clothing?: A Little Help from another person to put on and taking off regular lower body clothing?: A Little 6 Click Score: 19   End of Session Equipment Utilized During Treatment: Gait belt Nurse Communication: Mobility status  Activity Tolerance: Patient tolerated treatment well Patient left: in bed;with call bell/phone within reach;with bed alarm set;with nursing/sitter in room  OT Visit Diagnosis: Unsteadiness on feet (R26.81)                Time: 3159-4585 OT Time Calculation (min): 24 min Charges:  OT Evaluation $OT Eval Low Complexity: 1 Low OT Treatments $Self Care/Home Management : 8-22 mins  Jeani Hawking, OTR/L Acute Rehabilitation Services Pager [phone] Office [phone]   Jeani Hawking M 05/31/2018, 5:11 PM

## 2018-05-31 NOTE — ED Triage Notes (Signed)
pt arrives via EMS from home with delayed speech per family. Pt c/o dizziness and unsteady gait since Wednesday. 142/74, HR 60, 16 RR, 100% rA, 97.3, CBG 90.

## 2018-06-01 ENCOUNTER — Inpatient Hospital Stay (HOSPITAL_COMMUNITY): Payer: Medicare Other

## 2018-06-01 DIAGNOSIS — E785 Hyperlipidemia, unspecified: Secondary | ICD-10-CM

## 2018-06-01 DIAGNOSIS — I6302 Cerebral infarction due to thrombosis of basilar artery: Secondary | ICD-10-CM

## 2018-06-01 DIAGNOSIS — I6389 Other cerebral infarction: Secondary | ICD-10-CM

## 2018-06-01 DIAGNOSIS — I10 Essential (primary) hypertension: Secondary | ICD-10-CM

## 2018-06-01 DIAGNOSIS — E119 Type 2 diabetes mellitus without complications: Secondary | ICD-10-CM

## 2018-06-01 LAB — GLUCOSE, CAPILLARY
GLUCOSE-CAPILLARY: 131 mg/dL — AB (ref 70–99)
Glucose-Capillary: 103 mg/dL — ABNORMAL HIGH (ref 70–99)
Glucose-Capillary: 116 mg/dL — ABNORMAL HIGH (ref 70–99)
Glucose-Capillary: 135 mg/dL — ABNORMAL HIGH (ref 70–99)

## 2018-06-01 LAB — HIV ANTIBODY (ROUTINE TESTING W REFLEX): HIV Screen 4th Generation wRfx: NONREACTIVE

## 2018-06-01 LAB — LIPID PANEL
CHOLESTEROL: 158 mg/dL (ref 0–200)
HDL: 41 mg/dL (ref >40)
LDL CALC: 101 mg/dL — AB (ref 0–99)
TRIGLYCERIDES: 79 mg/dL (ref <150)
Total CHOL/HDL Ratio: 3.9 RATIO
VLDL: 16 mg/dL (ref 0–40)

## 2018-06-01 LAB — ECHOCARDIOGRAM COMPLETE: Weight: 3200 oz

## 2018-06-01 MED ORDER — IOPAMIDOL (ISOVUE-370) INJECTION 76%
50.0000 mL | Freq: Once | INTRAVENOUS | Status: AC | PRN
  Administered 2018-06-01: 50 mL via INTRAVENOUS

## 2018-06-01 MED ORDER — ATORVASTATIN CALCIUM 40 MG PO TABS
40.0000 mg | ORAL_TABLET | Freq: Every day | ORAL | Status: DC
  Administered 2018-06-01: 40 mg via ORAL
  Filled 2018-06-01 (×2): qty 1

## 2018-06-01 NOTE — Progress Notes (Signed)
  PROGRESS NOTE  Christian Mclaughlin HEN:[id number] DOB: April 09, 1946 DOA: 05/31/2018 PCP: Barbie Banner, MD  Brief Narrative: 72 year old man PMH diabetes mellitus type 2, hypertension presented with ataxia, lightheadedness, difficulty speaking.  Admitted for acute stroke.  Assessment/Plan Acute stroke with presenting ataxia and dysarthria --Seems to be improving.  Follow-up echocardiogram.  CT angiogram head and neck unremarkable.  Continue statin, aspirin, Plavix as per neurology.  Diabetes mellitus type 2 appears stable --Continue sliding scale insulin  Hyperlipidemia --Continue statin  Essential hypertension, will allow permissive hypertension for now.    Improving.  Follow-up final stroke team recommendations and echocardiogram.  Anticipate discharge 9/10.  DVT prophylaxis: enoxaparin Code Status: full Family Communication:  Disposition Plan: home    Brendia Sacks, MD  [facility] Direct contact: [phone] --Via amion app OR  --[email or web address]; password [email or web address]  7PM-7AM contact night coverage as above 06/01/2018, 12:36 PM  LOS: 1 day   Consultants:  Neurology   Procedures:    Antimicrobials:    Interval history/Subjective: Feels better; no difficulty speaking or swallowing; no numbness or weakness of extremities. Walking better.   Objective: Vitals:  Vitals:   06/01/18 0818 06/01/18 1156  BP: (!) 150/76 134/73  Pulse:  (!) 57  Resp: 18 16  Temp: 98.7 F (37.1 C) 98.3 F (36.8 C)  SpO2: 100% 100%    Exam:  Constitutional:  . Appears calm and comfortable Eyes:  . pupils and irises appear normal . Normal lids   ENMT:  . grossly normal hearing  . Lips appear normal Respiratory:  . CTA bilaterally, no w/r/r.  . Respiratory effort normal.  Cardiovascular:  . RRR, 2/6 systolic murmur; no r/g . No LE extremity edema   Musculoskeletal:  . RUE, LUE, RLE, LLE   o strength and tone normal, no atrophy, no abnormal movements o No tenderness,  masses . Negative Romberg  Neurologic:  . CN 2-12 intact except mild right facial droop . No UE dysdiadokinesis . No pronator drift Psychiatric:  . Mental status o Mood, affect appropriate  I have personally reviewed the following:   Data: . BMP notable for creatinine 1.4, troponin negative, LDL 101, CBC, hemoglobin A1c 8.3 . MRI brain, CT angios head and neck noted  Scheduled Meds: . amLODipine  10 mg Oral Daily  . aspirin  81 mg Oral Daily  . atorvastatin  40 mg Oral q1800  . clopidogrel  75 mg Oral Daily  . enoxaparin (LOVENOX) injection  40 mg Subcutaneous Q24H  . finasteride  5 mg Oral Daily  . insulin aspart  0-15 Units Subcutaneous TID WC  . insulin aspart  0-5 Units Subcutaneous QHS  . lisinopril  10 mg Oral Daily  . pantoprazole  20 mg Oral Daily  . terazosin  5 mg Oral QHS   Continuous Infusions:  Principal Problem:   CVA (cerebral vascular accident) (HCC) Active Problems:   Hypertension   DM2 (diabetes mellitus, type 2) (HCC)   Hyperlipidemia   LOS: 1 day

## 2018-06-01 NOTE — Progress Notes (Signed)
  Echocardiogram 2D Echocardiogram has been performed.  Christian Mclaughlin 06/01/2018, 3:29 PM

## 2018-06-01 NOTE — Evaluation (Signed)
Speech Language Pathology Evaluation Patient Details Name: Christian Mclaughlin MRN: [id number] DOB: 13-Aug-1946 Today's Date: 06/01/2018 Time: 3149-7026 SLP Time Calculation (min) (ACUTE ONLY): 28 min  Problem List:  Patient Active Problem List   Diagnosis Date Noted  . CVA (cerebral vascular accident) (HCC) 05/31/2018  . Hypertension 05/31/2018  . DM2 (diabetes mellitus, type 2) (HCC) 05/31/2018  . Hyperlipidemia 05/31/2018   Past Medical History:  Past Medical History:  Diagnosis Date  . Diabetes mellitus without complication (HCC)   . GERD (gastroesophageal reflux disease)   . Hypertension   . Prostate disease   . Sleep apnea    Past Surgical History: History reviewed. No pertinent surgical history. HPI:  72 yo male adm to [facility] with ataxia, weakness, dysarthria.  pmh + htn, diabetes, hld.  Pt found to have non hemorrhagic left paramedian pontine cva.  Pt is retired from [employer] and [employer].    Assessment / Plan / Recommendation Clinical Impression  Pt MOCA 7.2 score is 25/30- just below normal.  Speech is clear without evidence of dysarthria.  He does admit to premorbid difficulties getting "lost" in converstions over the last few years.  Stating this has not worsened nor improved.  No SlP follow up needed as pt at baseline level of function.      SLP Assessment  SLP Recommendation/Assessment: Patient does not need any further Speech Lanaguage Pathology Services SLP Visit Diagnosis: Dysarthria and anarthria (R47.1)    Follow Up Recommendations  None    Frequency and Duration           SLP Evaluation Cognition  Overall Cognitive Status: Within Functional Limits for tasks assessed Arousal/Alertness: Awake/alert Orientation Level: Oriented X4 Attention: Focused;Sustained Focused Attention: Appears intact Sustained Attention: Appears intact Memory: Impaired Memory Impairment: Retrieval deficit(1/5 words I, 4/5 with cues) Awareness: Appears intact Problem Solving:  Appears intact Safety/Judgment: Appears intact       Comprehension  Auditory Comprehension Overall Auditory Comprehension: Appears within functional limits for tasks assessed Yes/No Questions: Not tested Commands: Within Functional Limits Conversation: Complex Interfering Components: Attention;Hearing Visual Recognition/Discrimination Discrimination: Within Function Limits Reading Comprehension Reading Status: Not tested    Expression Expression Primary Mode of Expression: Verbal Verbal Expression Overall Verbal Expression: Appears within functional limits for tasks assessed Initiation: No impairment Repetition: No impairment Naming: No impairment(named 13 words in sixty seconds) Pragmatics: No impairment Written Expression Dominant Hand: Right   Oral / Motor  Oral Motor/Sensory Function Overall Oral Motor/Sensory Function: Within functional limits Motor Speech Overall Motor Speech: Appears within functional limits for tasks assessed Respiration: Within functional limits Resonance: Within functional limits Articulation: Within functional limitis Intelligibility: Intelligible Motor Planning: Witnin functional limits   GO                    Chales Abrahams 06/01/2018, 9:26 AM  Donavan Burnet, MS [facility] SLP [phone]

## 2018-06-01 NOTE — Evaluation (Signed)
Physical Therapy Evaluation Patient Details Name: Christian Mclaughlin MRN: [id number] DOB: [birth year] Today's Date: 06/01/2018   History of Present Illness  This 72 y.o. male admitted with one day h/o ataxia, light headedness, and speech difficulty.  MRI of brain showed:  Acute nonhemorrhagic left paramedian pontine infarct.  Clinical Impression  Patient presents with decreased independence with mobility due to decreased balance and general weakness.  DGI performed demonstrates fall risk with community mobility with score of 19/24; Timed up and Go cognitive test demonstrates fall risk with score >15 seconds.  Will follow during acute stay for balance, strength and safety.  No follow up PT recommended at d/c.     Follow Up Recommendations No PT follow up    Equipment Recommendations  None recommended by PT    Recommendations for Other Services       Precautions / Restrictions Precautions Precautions: Fall Precaution Comments: Pt reports he has fall x1 in past week, and had several losses of balance       Mobility  Bed Mobility Overal bed mobility: Modified Independent;Independent                Transfers Overall transfer level: Modified independent Equipment used: None                Ambulation/Gait Ambulation/Gait assistance: Supervision Gait Distance (Feet): 250 Feet Assistive device: None Gait Pattern/deviations: Step-through pattern;Decreased stride length     General Gait Details: see DGI  Stairs Stairs: Yes Stairs assistance: Min guard Stair Management: No rails;Forwards;Step to pattern;Alternating pattern Number of Stairs: 2 General stair comments: step through to ascend, step to for descent with hesitation at top of steps minguard for safety to descend  Wheelchair Mobility    Modified Rankin (Stroke Patients Only) Modified Rankin (Stroke Patients Only) Pre-Morbid Rankin Score: No symptoms Modified Rankin: Moderate disability     Balance  Overall balance assessment: Needs assistance   Sitting balance-Leahy Scale: Good       Standing balance-Leahy Scale: Fair                   Standardized Balance Assessment Standardized Balance Assessment : Dynamic Gait Index;TUG: Timed Up and Go Test   Dynamic Gait Index Level Surface: Normal Change in Gait Speed: Mild Impairment Gait with Horizontal Head Turns: Normal Gait with Vertical Head Turns: Moderate Impairment Gait and Pivot Turn: Normal Step Over Obstacle: Mild Impairment Step Around Obstacles: Normal Steps: Mild Impairment Total Score: 19 Timed Up and Go Test TUG: Normal TUG;Cognitive TUG Normal TUG (seconds): 11.9 Cognitive TUG (seconds): 16.4     Pertinent Vitals/Pain Pain Assessment: No/denies pain    Home Living Family/patient expects to be discharged to:: Private residence Living Arrangements: Spouse/significant other;Children Available Help at Discharge: Family;Available 24 hours/day Type of Home: House Home Access: Stairs to enter Entrance Stairs-Rails: None Entrance Stairs-Number of Steps: 1 Home Layout: One level   Additional Comments: Pt lives with spouse and adult daughter who has autism     Prior Function Level of Independence: Independent         Comments: Pt is independent with driving and IADLs including yard work.  He is retired from [employer]eron[employer]   Dominant Hand: Right    Extremity/Trunk Assessment   Upper Extremity Assessment Upper Extremity Assessment: Defer to OT evaluation    Lower Extremity Assessment Lower Extremity Assessment: Overall WFL for tasks assessed       Communication  Communication: No difficulties  Cognition Arousal/Alertness: Awake/alert Behavior During Therapy: WFL for tasks assessed/performed Overall Cognitive Status: Within Functional Limits for tasks assessed                                        General Comments General comments (skin  integrity, edema, etc.): verbally educated in fall risk reduction    Exercises     Assessment/Plan    PT Assessment Patient needs continued PT services  PT Problem List Decreased mobility;Decreased balance;Decreased safety awareness       PT Treatment Interventions DME instruction;Therapeutic activities;Patient/family education;Gait training;Stair training;Balance training;Functional mobility training    PT Goals (Current goals can be found in the Care Plan section)  Acute Rehab PT Goals Patient Stated Goal: to get back to normal  PT Goal Formulation: With patient/family Time For Goal Achievement: 06/08/18 Potential to Achieve Goals: Good    Frequency Min 4X/week   Barriers to discharge        Co-evaluation               AM-PAC PT "6 Clicks" Daily Activity  Outcome Measure Difficulty turning over in bed (including adjusting bedclothes, sheets and blankets)?: None Difficulty moving from lying on back to sitting on the side of the bed? : None Difficulty sitting down on and standing up from a chair with arms (e.g., wheelchair, bedside commode, etc,.)?: None Help needed moving to and from a bed to chair (including a wheelchair)?: None Help needed walking in hospital room?: A Little Help needed climbing 3-5 steps with a railing? : A Little 6 Click Score: 22    End of Session Equipment Utilized During Treatment: Gait belt Activity Tolerance: Patient tolerated treatment well Patient left: with call bell/phone within reach;with family/visitor present   PT Visit Diagnosis: Other abnormalities of gait and mobility (R26.89)    Time: 1110-1130 PT Time Calculation (min) (ACUTE ONLY): 20 min   Charges:   PT Evaluation $PT Eval Low Complexity: 1 Low          Sheran Lawless, [place] [phone] 06/01/2018   Elray Mcgregor 06/01/2018, 12:13 PM

## 2018-06-01 NOTE — Progress Notes (Signed)
Occupational Therapy Progress Note and Discharge  Pt is now mod I with ADLs.  He demonstrates very mild balance deficits.  He has achieved all of his goals, no further acute OT needs identified.  OT will sign off at this time, pt agrees with progress and goals achieved.   No follow up OT recommended and no DME recommended    06/01/18 1400  OT Visit Information  Last OT Received On 06/01/18  Assistance Needed +1  History of Present Illness This 72 y.o. male admitted with one day h/o ataxia, light headedness, and speech difficulty.  MRI of brain showed:  Acute nonhemorrhagic left paramedian pontine infarct.  Precautions  Precautions Fall  Precaution Comments Pt reports he has fall x1 in past week, and had several losses of balance   Pain Assessment  Pain Assessment No/denies pain  Cognition  Arousal/Alertness Awake/alert  Behavior During Therapy WFL for tasks assessed/performed  Overall Cognitive Status Within Functional Limits for tasks assessed  General Comments Pt able to perform serial 2s to and from 100 while ambulating with no errors and no LOB   Upper Extremity Assessment  Upper Extremity Assessment Overall WFL for tasks assessed  Lower Extremity Assessment  Lower Extremity Assessment Defer to PT evaluation  ADL  Overall ADL's  Modified independent  General ADL Comments Pt able to simulate shower in standing mod I with UE support   Bed Mobility  Overal bed mobility Independent  Balance  Standing balance support During functional activity  Standing balance-Leahy Scale Fair  Vision- Assessment  Additional Comments    Transfers  Overall transfer level Modified independent  General Comments  General comments (skin integrity, edema, etc.) Pt and wife instructed in BEFAST   OT - End of Session  Activity Tolerance Patient tolerated treatment well  Patient left in bed;with call bell/phone within reach;with family/visitor present  OT Assessment/Plan  OT Plan All goals met and  education completed, patient discharged from OT services;Equipment recommendations need to be updated  OT Visit Diagnosis Unsteadiness on feet (R26.81)  OT Frequency (ACUTE ONLY) Min 2X/week  Follow Up Recommendations No OT follow up;Supervision - Intermittent  OT Equipment None recommended by OT  AM-PAC OT "6 Clicks" Daily Activity Outcome Measure  Help from another person eating meals? 4  Help from another person taking care of personal grooming? 4  Help from another person toileting, which includes using toliet, bedpan, or urinal? 4  Help from another person bathing (including washing, rinsing, drying)? 4  Help from another person to put on and taking off regular upper body clothing? 4  Help from another person to put on and taking off regular lower body clothing? 4  6 Click Score 24  ADL G Code Conversion CH  OT Goal Progression  Progress towards OT goals Goals met/education completed, patient discharged from OT  OT Time Calculation  OT Start Time (ACUTE ONLY) 1046  OT Stop Time (ACUTE ONLY) 1100  OT Time Calculation (min) 14 min  OT Treatments  $Therapeutic Activity 8-22 mins  Lucille Passy, OTR/L Acute Rehabilitation Services Pager [phone] Office [phone]

## 2018-06-01 NOTE — Progress Notes (Addendum)
NEUROHOSPITALISTS STROKE TEAM - DAILY PROGRESS NOTE    HISTORY Christian Mclaughlin is an 72 y.o. male  With PMH of HTN, DM who presented to [facility] ED with a  4 day history of ataxia and being light headed.   Patient states that 4 days ago (05/27/18- Wednesday) he had a sudden on set of being light headed. He was sitting in a chair and then stood up and felt light headed/ dizzy. He then fell to his knees, but denies a LOC or hitting his head. The episode passed, and then he had another onset of dizziness on Thursday. No complications at all on Friday, and then again on Saturday. This morning it happened again while he was driving to get biscuits from [place]. While driving he noticed that he appeared to be swerving some  As if he " kept over correcting his steering". While in [place] he began stuttering. He continued on and returned home with his biscuits and EMS was called. He did have some slurred speech today with this event. BG was 64 this morning during the episode but was 164 on wednesday when it first started happening. He states that he had a light HA since this all happened. Denies any drug abuse, current smoking history ( he quit over 10 years ago),  Numbness, tingling, focal weakness, vision problems, or facial droop. Endorses taking ASA 81 mg daily and denies missing any doses, does drink socially. Denies any anticoagulation.  ED course:  MRI Brain: revealed a non hemorrhagic left paramedian pontine infarct, remote punctate lacunar infarct of the left internal capsule.   SUBJECTIVE Patient seen in room post physical therapy evaluation.  Patient has good mobility in bilateral upper and lower extremities without ataxia.  States he has mildly ataxic gait.  Denies headache, dizziness, blurred vision, nausea, vomiting  OBJECTIVE Most recent Vital Signs: Vitals:   05/31/18 2342 06/01/18 0412 06/01/18 0818 06/01/18 1156  BP: (!)  147/83 126/74 (!) 150/76 134/73  Pulse: (!) 48 (!) 51  (!) 57  Resp: 16 20 18 16   Temp: 98.6 F (37 C) 98.3 F (36.8 C) 98.7 F (37.1 C) 98.3 F (36.8 C)  TempSrc: Oral Oral Oral Oral  SpO2: 98% 100% 100% 100%   CBG (last 3)  Recent Labs    05/31/18 2120 06/01/18 0629 06/01/18 1152  GLUCAP 140* 103* 131*    Physical Exam  HEENT-  Normocephalic, no lesions, without obvious abnormality.  Normal external eye and conjunctiva.   Cardiovascular- S1-S2 audible, pulses palpable throughout   Lungs-no rhonchi or wheezing noted, no excessive working breathing.  Saturations within normal limits Abdomen- All 4 quadrants palpated and nontender Musculoskeletal-no joint tenderness, deformity or swelling Skin-warm and dry, no hyperpigmentation, vitiligo, or suspicious lesions  Neuro:  Mental Status: Alert, oriented, thought content appropriate.  Speech fluent without evidence of aphasia.  Able to follow 3 step commands without difficulty. Cranial Nerves: II:  Visual fields grossly normal,  III,IV, VI: ptosis not present, extra-ocular motions intact bilaterally pupils equal, round, reactive to light and accommodation V,VII: smile symmetric, facial light touch sensation normal bilaterally VIII: hearing intact to voice IX,X: uvula rises symmetrically XI: bilateral shoulder shrug XII: midline tongue  extension Motor: Right : Upper extremity   5/5    Left:     Upper extremity   5/5  Lower extremity   5/5     Lower extremity   5/5 Tone and bulk:normal tone throughout; no atrophy noted Sensory: sensation intact throughout, bilaterally Deep Tendon Reflexes: 2+ and symmetric throughout Plantars: Right: downgoing   Left: downgoing Cerebellar: normal finger-to-nose, normal rapid alternating movements and normal heel-to-shin test Gait: normal gait and station  IV Fluid Intake:    MEDICATIONS  . amLODipine  10 mg Oral Daily  . aspirin  81 mg Oral Daily  . atorvastatin  40 mg Oral q1800  .  clopidogrel  75 mg Oral Daily  . enoxaparin (LOVENOX) injection  40 mg Subcutaneous Q24H  . finasteride  5 mg Oral Daily  . insulin aspart  0-15 Units Subcutaneous TID WC  . insulin aspart  0-5 Units Subcutaneous QHS  . lisinopril  10 mg Oral Daily  . pantoprazole  20 mg Oral Daily  . terazosin  5 mg Oral QHS   PRN:  acetaminophen **OR** acetaminophen (TYLENOL) oral liquid 160 mg/5 mL **OR** acetaminophen, acetaminophen, senna-docusate  Diet:   Diet Order            Diet Carb Modified Fluid consistency: Thin; Room service appropriate? Yes  Diet effective now               CLINICALLY SIGNIFICANT STUDIES Basic Metabolic Panel:  Recent Labs  Lab 05/31/18 1114  NA 142  K 3.7  CL 109  CO2 25  GLUCOSE 142*  BUN 15  CREATININE 1.40*  CALCIUM 9.1   Liver Function Tests:  Recent Labs  Lab 05/31/18 1114  AST 14*  ALT 13  ALKPHOS 57  BILITOT 0.8  PROT 6.4*  ALBUMIN 3.6   CBC:  Recent Labs  Lab 05/31/18 1114  WBC 4.2  NEUTROABS 2.7  HGB 13.5  HCT 43.3  MCV 82.8  PLT 237   Coagulation:  Recent Labs  Lab 05/31/18 1114  LABPROT 13.6  INR 1.05   Cardiac Enzymes: No results for input(s): CKTOTAL, CKMB, CKMBINDEX, TROPONINI in the last 168 hours. Urinalysis:  Recent Labs  Lab 05/31/18 1841  COLORURINE YELLOW  LABSPEC 1.015  PHURINE 6.0  GLUCOSEU 150*  HGBUR NEGATIVE  BILIRUBINUR NEGATIVE  KETONESUR NEGATIVE  PROTEINUR NEGATIVE  NITRITE NEGATIVE  LEUKOCYTESUR NEGATIVE   Lipid Panel    Component Value Date/Time   CHOL 158 06/01/2018 0626   TRIG 79 06/01/2018 0626   HDL 41 06/01/2018 0626   CHOLHDL 3.9 06/01/2018 0626   VLDL 16 06/01/2018 0626   LDLCALC 101 (H) 06/01/2018 0626   HgbA1C  Lab Results  Component Value Date   HGBA1C 8.3 (H) 05/31/2018    Urine Drug Screen:      Component Value Date/Time   LABOPIA NONE DETECTED 05/31/2018 1841   COCAINSCRNUR NONE DETECTED 05/31/2018 1841   LABBENZ NONE DETECTED 05/31/2018 1841   AMPHETMU  NONE DETECTED 05/31/2018 1841   THCU NONE DETECTED 05/31/2018 1841   LABBARB NONE DETECTED 05/31/2018 1841    Alcohol Level: No results for input(s): ETH in the last 168 hours.  Ct Angio Head/Neck W Or Wo Contrast 06/01/2018 IMPRESSION:  No emergent large vessel occlusion or hemodynamically significant stenosis of the head or neck.   Mr Brain Wo Contrast 05/31/2018 IMPRESSION:  1. Acute nonhemorrhagic left paramedian pontine infarct.  2. Remote punctate lacunar infarct of the left internal capsule.  3. Degenerative  changes of the upper cervical spine chronic endplate changes and some loss of height.   EKG  SB HR 47, with ST and T wave abnormality  Outstanding Stroke Work-up Studies:     Echocardiogram:                                                    PENDING B/L Carotid U/S:                                                     PENDING     ASSESSMENT/PLAN Christian Mclaughlin is an 72 y.o. male  With PMH of HTN, DM who presented to [facility] ED with a  4 day history of ataxia and being light headed.   Stroke:    Left paramedian pontine infarct  Code Stroke: No  CTA head & neck No emergent large vessel occlusion or hemodynamically significant stenosis of the head or neck.   MRI of the brain:  Acute nonhemorrhagic left paramedian pontine infarct,  Remote punctate lacunar infarct of the left internal capsule.   2D Echocardiogram: pending  LDL 101  HgbA1c 8.3  VTE : Lovenox  Antiplatelets: Aspirin 81mg  PO daily at home, Started  on Plavix 75mg  while in hospital, recommend to continue dual antiplatelet aspirin 81 mg and Plavix 75 mg for 3 weeks, then Plavix alone.  Atorvastatin 40mg   Continue Rehab with PT consult, OT consult, Speech consult  Therapy recommendations: No PT/OT/ST recomendations  Disposition: home   Hypertension  Elevated SBP 130s to 150s  BP goal over night 140-160. Ok to resume standard post stroke BP goal < 220/110  Home medications: Amlodipine,Lisinopril,  Caduet  BP goal normotensive  Resume home blood pressure medications   Hyperlipidemia  Home meds: Pravastatin 40   LDL 101 goal < 70  Add lipitor 40 mg daily  Continue Atorvastatin at discharge  Diabetes mellitus  Hemoglobin A1c 8.3  Continue home metformin regimen and follow-up with PCP for tighter glycemic control  Other Stroke Risk Factors  Advanced age    Other Active Problems  Sleep apnea  BPH  Hospital day # 1  SIGNED [facility] Neuro-hospitalist Team [phone] 06/01/2018, 2:43 PM   06/01/2018 ATTENDING ASSESSMENT   ATTENDING NOTE: I reviewed above note and agree with the assessment and plan. I have made any additions or clarifications directly to the above note. Pt was seen and examined.   73 year old male with history of hypertension, diabetes, OSA admitted for dizziness, ataxia, slurred speech and fall on standing up.  MRI showed left pontine infarct.  CTA head and neck unremarkable.  EF 55 to 60%.  LDL 101 and A1c 8.3.  Patient stroke most likely due to small vessel disease given uncontrolled risk factors of diabetes and hypertension, and hyperlipidemia.  Recommend to continue aspirin 81 and Plavix DAPT for 3 weeks and then Plavix alone.  Changed pravastatin to Lipitor 40.  Stroke risk factor modification.   Neurology will sign off. Please call with questions. Pt will follow up with stroke clinic NP at [facility] in about 4 weeks. Thanks for the consult.   Marvel Plan, MD PhD Stroke Neurology 06/01/2018 8:08 PM  To contact Stroke Continuity provider, please refer to [email or web address] After hours, contact General Neurology

## 2018-06-02 DIAGNOSIS — E1159 Type 2 diabetes mellitus with other circulatory complications: Secondary | ICD-10-CM

## 2018-06-02 LAB — GLUCOSE, CAPILLARY
Glucose-Capillary: 146 mg/dL — ABNORMAL HIGH (ref 70–99)
Glucose-Capillary: 138 mg/dL — ABNORMAL HIGH (ref 70–99)

## 2018-06-02 MED ORDER — ATORVASTATIN CALCIUM 40 MG PO TABS: 40.0000 mg | ORAL_TABLET | Freq: Every day | ORAL | 0 refills | Status: AC

## 2018-06-02 MED ORDER — ASPIRIN 81 MG PO CHEW: 81.0000 mg | CHEWABLE_TABLET | Freq: Every day | ORAL | Status: AC

## 2018-06-02 MED ORDER — CLOPIDOGREL BISULFATE 75 MG PO TABS: 75.0000 mg | ORAL_TABLET | Freq: Every day | ORAL | 0 refills | Status: AC

## 2018-06-02 NOTE — Care Management Note (Signed)
Case Management Note  Patient Details  Name: Christian Mclaughlin MRN: [id number] Date of Birth: 1946-05-22  Subjective/Objective:      Pt admitted with CVA. He is from home with spouse.  PCP:    Dr Andrey Campanile Insurance: medicare           Action/Plan: No f/u and no DME needs per PT/OT. Pt discharging home with self care. Pt has transportation home.   Expected Discharge Date:  06/02/18               Expected Discharge Plan:  Home/Self Care  In-House Referral:     Discharge planning Services     Post Acute Care Choice:    Choice offered to:     DME Arranged:    DME Agency:     HH Arranged:    HH Agency:     Status of Service:  Completed, signed off  If discussed at [employer] of Stay Meetings, dates discussed:    Additional Comments:  Kermit Balo, RN 06/02/2018, 12:48 PM

## 2018-06-02 NOTE — Progress Notes (Signed)
Physical Therapy Treatment Patient Details Name: Christian Mclaughlin MRN: [id number] DOB: March 14, 1946 Today's Date: 06/02/2018    History of Present Illness This 72 y.o. male admitted with one day h/o ataxia, light headedness, and speech difficulty.  MRI of brain showed:  Acute nonhemorrhagic left paramedian pontine infarct.    PT Comments    Patient continues to make progress toward PT goals. Current plan remains appropriate.    Follow Up Recommendations  No PT follow up     Equipment Recommendations  None recommended by PT    Recommendations for Other Services       Precautions / Restrictions Precautions Precautions: Fall Precaution Comments: Pt reports he has fall x1 in past week, and had several losses of balance     Mobility  Bed Mobility Overal bed mobility: Independent                Transfers Overall transfer level: Independent               General transfer comment: pt able to stand without using UE   Ambulation/Gait Ambulation/Gait assistance: Modified independent (Device/Increase time) Gait Distance (Feet): (~45ft total) Assistive device: None Gait Pattern/deviations: Step-through pattern   Gait velocity interpretation: >2.62 ft/sec, indicative of community ambulatory General Gait Details: pt with steady gait and decreased cadence without cues   Stairs             Wheelchair Mobility    Modified Rankin (Stroke Patients Only) Modified Rankin (Stroke Patients Only) Pre-Morbid Rankin Score: No symptoms Modified Rankin: Slight disability     Balance                                 Standardized Balance Assessment Standardized Balance Assessment : Dynamic Gait Index   Dynamic Gait Index Level Surface: Normal Change in Gait Speed: Normal Gait with Horizontal Head Turns: Normal Gait with Vertical Head Turns: Mild Impairment Gait and Pivot Turn: Normal Step Over Obstacle: Normal Step Around Obstacles: Normal Steps:  Normal Total Score: 23      Cognition Arousal/Alertness: Awake/alert Behavior During Therapy: WFL for tasks assessed/performed Overall Cognitive Status: Within Functional Limits for tasks assessed                                        Exercises      General Comments        Pertinent Vitals/Pain Pain Assessment: No/denies pain    Home Living                      Prior Function            PT Goals (current goals can now be found in the care plan section) Progress towards PT goals: Progressing toward goals    Frequency    Min 4X/week      PT Plan Current plan remains appropriate    Co-evaluation              AM-PAC PT "6 Clicks" Daily Activity  Outcome Measure  Difficulty turning over in bed (including adjusting bedclothes, sheets and blankets)?: None Difficulty moving from lying on back to sitting on the side of the bed? : None Difficulty sitting down on and standing up from a chair with arms (e.g., wheelchair, bedside commode, etc,.)?: None Help needed moving to and  from a bed to chair (including a wheelchair)?: None Help needed walking in hospital room?: None Help needed climbing 3-5 steps with a railing? : A Little 6 Click Score: 23    End of Session Equipment Utilized During Treatment: Gait belt Activity Tolerance: Patient tolerated treatment well Patient left: in chair;with call bell/phone within reach Nurse Communication: Mobility status PT Visit Diagnosis: Other abnormalities of gait and mobility (R26.89)     Time: 1610-9604 PT Time Calculation (min) (ACUTE ONLY): 13 min  Charges:  $Gait Training: 8-22 mins                     Erline Levine, PTA Acute Rehabilitation Services Pager: [phone]     Carolynne Edouard 06/02/2018, 12:23 PM

## 2018-06-02 NOTE — Progress Notes (Signed)
Nurse went over discharge with patient and family. Patient and family verbalized understanding of discharge. All questions and concern addressed. All belongings sent with patient. Discharging home and taking down in a wheelchair.

## 2018-06-02 NOTE — Discharge Summary (Addendum)
Physician Discharge Summary  Christian Mclaughlin QMG:[id number] DOB: 12/14/1945 DOA: 05/31/2018  PCP: Barbie Banner, MD  Admit date: 05/31/2018 Discharge date: 06/02/2018  Recommendations for Outpatient Follow-up:  1. Follow-up stroke. 2. Continued optimization of blood pressure and glycemic control.  Follow-up Information    [facility]. Schedule an appointment as soon as possible for a visit in 4 week(s).   Specialty:  Radiology Contact information: [address] [place] [address] [phone]       Barbie Banner, MD. Schedule an appointment as soon as possible for a visit in 1 week(s).   Specialty:  Family Medicine Contact information: [address] [place] [address] [phone]            Discharge Diagnoses:  1. Acute stroke presenting with ataxia and dysarthria, secondary to small vessel disease 2. Diabetes mellitus type 2 3. Essential hypertension 4. Hyperlipidemia  Discharge Condition: improved Disposition: home  Diet recommendation: low sodium heart healthy, diabetic diet  History of present illness:  72 year old man PMH diabetes mellitus type 2, hypertension presented with ataxia, lightheadedness, difficulty speaking.  Admitted for acute stroke.  Hospital Course:  Patient was seen by neurology and completed stroke evaluation.  Echocardiogram, CT angiogram head and neck unremarkable.    Felt to be secondary to small vessel disease given uncontrolled risk factors of diabetes, hypertension and hyperlipidemia.  Neurology recommended aspirin 81 mg daily and Plavix for 3 weeks and then Plavix alone.  Pravastatin changed to Lipitor.  Evaluated by physical, occupational and speech therapy.  No need for outpatient follow-up.  Diabetes mellitus type 2 hemoglobin A1c 8.3, blood sugars well controlled during hospitalization.  Hyperlipidemia --Continue statin  Essential hypertension  Study  Conclusions  - Left ventricle: The cavity size was normal. Wall thickness was   increased in a pattern of mild LVH. Systolic function was normal.   The estimated ejection fraction was in the range of 55% to 60%.   Wall motion was normal; there were no regional wall motion   abnormalities. Doppler parameters are consistent with abnormal   left ventricular relaxation (grade 1 diastolic dysfunction).   Doppler parameters are consistent with high ventricular filling   pressure. - Aortic valve: Transvalvular velocity was within the normal range.   There was no stenosis. There was no regurgitation. - Mitral valve: Transvalvular velocity was within the normal range.   There was no evidence for stenosis. There was trivial   regurgitation. - Left atrium: The atrium was mildly dilated. - Right ventricle: The cavity size was normal. Wall thickness was   normal. Systolic function was normal. - Atrial septum: No defect or patent foramen ovale was identified. - Tricuspid valve: There was no regurgitation.  Today's assessment: S: feels well, no weakness, no paresthesias, no difficulty speaking or swallowing. O: Vitals:  Vitals:   06/02/18 0737 06/02/18 1127  BP: 138/83 (!) 153/85  Pulse: (!) 51 (!) 52  Resp: 16 20  Temp: 98.3 F (36.8 C) 98 F (36.7 C)  SpO2: 100% 98%    Constitutional:  . Appears calm and comfortable Eyes:  . pupils appear normal ENMT:  . grossly normal hearing  Respiratory:  . CTA bilaterally, no w/r/r.  . Respiratory effort normal.  Cardiovascular:  . RRR, no m/r/g Telemetry SB . No LE extremity edema   Musculoskeletal:  . RUE, LUE, RLE, LLE   o strength and tone normal Neurologic:  . CN 2-12 appear intact Psychiatric:  .  Mental status o Mood, affect appropriate  Echocardiogram noted  Discharge Instructions  Discharge Instructions    Activity as tolerated - No restrictions   Complete by:  As directed    Ambulatory referral to Neurology   Complete  by:  As directed    Follow up with stroke clinic NP (Jessica Vanschaick or Darrol Angel, if both not available, consider Manson Allan, or A[facility]) at [facility] in about 4 weeks. Thanks.   Diet - low sodium heart healthy   Complete by:  As directed    Diet Carb Modified   Complete by:  As directed    Discharge instructions   Complete by:  As directed    Call your physician or seek immediate medical attention for weakness, numbness, fever, falls, difficulty speaking or swallowing or worsening of condition.     Allergies as of 06/02/2018   No Known Allergies     Medication List    STOP taking these medications   amlodipine-atorvastatin 10-10 MG tablet Commonly known as:  CADUET   pravastatin 40 MG tablet Commonly known as:  PRAVACHOL   predniSONE 20 MG tablet Commonly known as:  DELTASONE     TAKE these medications   acetaminophen 500 MG tablet Commonly known as:  TYLENOL Take 1,000 mg by mouth every 6 (six) hours as needed for mild pain or headache.   amLODipine 10 MG tablet Commonly known as:  NORVASC Take 10 mg by mouth daily.   aspirin 81 MG chewable tablet Chew 1 tablet (81 mg total) by mouth daily for 21 days. Take for 3 weeks, then stop. What changed:  additional instructions   atorvastatin 40 MG tablet Commonly known as:  LIPITOR Take 1 tablet (40 mg total) by mouth daily at 6 PM.   clopidogrel 75 MG tablet Commonly known as:  PLAVIX Take 1 tablet (75 mg total) by mouth daily. Start taking on:  06/03/2018   diclofenac sodium 1 % Gel Commonly known as:  VOLTAREN Applied to the affected area 2 times a day. What changed:    how much to take  how to take this  when to take this  reasons to take this Notes to patient:  conti ue home schedule   finasteride 5 MG tablet Commonly known as:  PROSCAR Take 5 mg by mouth daily.   insulin glargine 100 UNIT/ML injection Commonly known as:  LANTUS Inject 36 Units into the skin. Notes to patient:  Continue home  schedule   lisinopril 10 MG tablet Commonly known as:  PRINIVIL,ZESTRIL Take 10 mg by mouth daily.   metFORMIN 500 MG 24 hr tablet Commonly known as:  GLUCOPHAGE-XR Take 500 mg by mouth daily with breakfast.   pantoprazole 20 MG tablet Commonly known as:  PROTONIX Take 20 mg by mouth daily.   terazosin 5 MG capsule Commonly known as:  HYTRIN Take 5 mg by mouth at bedtime. Notes to patient:  06/03/2018      No Known Allergies  The results of significant diagnostics from this hospitalization (including imaging, microbiology, ancillary and laboratory) are listed below for reference.    Significant Diagnostic Studies: Ct Angio Head W Or Wo Contrast  Result Date: 06/01/2018 CLINICAL DATA:  Dizziness, ataxia and speech difficulty. EXAM: CT ANGIOGRAPHY HEAD AND NECK TECHNIQUE: Multidetector CT imaging of the head and neck was performed using the standard protocol during bolus administration of intravenous contrast. Multiplanar CT image reconstructions and MIPs were obtained to evaluate the vascular anatomy. Carotid stenosis measurements (when applicable)  are obtained utilizing NASCET criteria, using the distal internal carotid diameter as the denominator. CONTRAST:  50mL ISOVUE-370 IOPAMIDOL (ISOVUE-370) INJECTION 76% COMPARISON:  Brain MRI 05/31/2018 FINDINGS: CT HEAD FINDINGS Brain: There is no mass, hemorrhage or extra-axial collection. The size and configuration of the ventricles and extra-axial CSF spaces are normal. There is no acute or chronic infarction. The brain parenchyma is normal. Skull: The visualized skull base, calvarium and extracranial soft tissues are normal. Sinuses/Orbits: No fluid levels or advanced mucosal thickening of the visualized paranasal sinuses. No mastoid or middle ear effusion. The orbits are normal. CTA NECK FINDINGS AORTIC ARCH: There is no calcific atherosclerosis of the aortic arch. There is no aneurysm, dissection or hemodynamically significant stenosis of  the visualized ascending aorta and aortic arch. Conventional 3 vessel aortic branching pattern. The visualized proximal subclavian arteries are widely patent. RIGHT CAROTID SYSTEM: --Common carotid artery: Widely patent origin without common carotid artery dissection or aneurysm. --Internal carotid artery: No dissection, occlusion or aneurysm. No hemodynamically significant stenosis. --External carotid artery: No acute abnormality. LEFT CAROTID SYSTEM: --Common carotid artery: Widely patent origin without common carotid artery dissection or aneurysm. --Internal carotid artery:No dissection, occlusion or aneurysm. No hemodynamically significant stenosis. --External carotid artery: No acute abnormality. VERTEBRAL ARTERIES: Left dominant configuration. There is narrowing of the diminutive right vertebral artery origin. The right vertebral artery terminates in PICA. The left vertebral artery is normal from its origin to the vertebrobasilar confluence. SKELETON: There is no bony spinal canal stenosis. No lytic or blastic lesion. OTHER NECK: Normal pharynx, larynx and major salivary glands. No cervical lymphadenopathy. Unremarkable thyroid gland. UPPER CHEST: No pneumothorax or pleural effusion. No nodules or masses. CTA HEAD FINDINGS ANTERIOR CIRCULATION: --Intracranial internal carotid arteries: Normal. --Anterior cerebral arteries: Normal. Both A1 segments are present. Patent anterior communicating artery. --Middle cerebral arteries: Normal. --Posterior communicating arteries: Absent bilaterally. POSTERIOR CIRCULATION: --Basilar artery: Normal. --Posterior cerebral arteries: Normal. --Superior cerebellar arteries: Normal. --Inferior cerebellar arteries: Normal variant pattern with the right vertebral artery terminating in PICA. VENOUS SINUSES: As permitted by contrast timing, patent. ANATOMIC VARIANTS: None DELAYED PHASE: No parenchymal contrast enhancement. Review of the MIP images confirms the above findings.  IMPRESSION: No emergent large vessel occlusion or hemodynamically significant stenosis of the head or neck. Electronically Signed   By: Deatra Robinson M.D.   On: 06/01/2018 01:09   Ct Angio Neck W Or Wo Contrast  Result Date: 06/01/2018 CLINICAL DATA:  Dizziness, ataxia and speech difficulty. EXAM: CT ANGIOGRAPHY HEAD AND NECK TECHNIQUE: Multidetector CT imaging of the head and neck was performed using the standard protocol during bolus administration of intravenous contrast. Multiplanar CT image reconstructions and MIPs were obtained to evaluate the vascular anatomy. Carotid stenosis measurements (when applicable) are obtained utilizing NASCET criteria, using the distal internal carotid diameter as the denominator. CONTRAST:  50mL ISOVUE-370 IOPAMIDOL (ISOVUE-370) INJECTION 76% COMPARISON:  Brain MRI 05/31/2018 FINDINGS: CT HEAD FINDINGS Brain: There is no mass, hemorrhage or extra-axial collection. The size and configuration of the ventricles and extra-axial CSF spaces are normal. There is no acute or chronic infarction. The brain parenchyma is normal. Skull: The visualized skull base, calvarium and extracranial soft tissues are normal. Sinuses/Orbits: No fluid levels or advanced mucosal thickening of the visualized paranasal sinuses. No mastoid or middle ear effusion. The orbits are normal. CTA NECK FINDINGS AORTIC ARCH: There is no calcific atherosclerosis of the aortic arch. There is no aneurysm, dissection or hemodynamically significant stenosis of the visualized ascending aorta and aortic arch. Conventional  3 vessel aortic branching pattern. The visualized proximal subclavian arteries are widely patent. RIGHT CAROTID SYSTEM: --Common carotid artery: Widely patent origin without common carotid artery dissection or aneurysm. --Internal carotid artery: No dissection, occlusion or aneurysm. No hemodynamically significant stenosis. --External carotid artery: No acute abnormality. LEFT CAROTID SYSTEM: --Common  carotid artery: Widely patent origin without common carotid artery dissection or aneurysm. --Internal carotid artery:No dissection, occlusion or aneurysm. No hemodynamically significant stenosis. --External carotid artery: No acute abnormality. VERTEBRAL ARTERIES: Left dominant configuration. There is narrowing of the diminutive right vertebral artery origin. The right vertebral artery terminates in PICA. The left vertebral artery is normal from its origin to the vertebrobasilar confluence. SKELETON: There is no bony spinal canal stenosis. No lytic or blastic lesion. OTHER NECK: Normal pharynx, larynx and major salivary glands. No cervical lymphadenopathy. Unremarkable thyroid gland. UPPER CHEST: No pneumothorax or pleural effusion. No nodules or masses. CTA HEAD FINDINGS ANTERIOR CIRCULATION: --Intracranial internal carotid arteries: Normal. --Anterior cerebral arteries: Normal. Both A1 segments are present. Patent anterior communicating artery. --Middle cerebral arteries: Normal. --Posterior communicating arteries: Absent bilaterally. POSTERIOR CIRCULATION: --Basilar artery: Normal. --Posterior cerebral arteries: Normal. --Superior cerebellar arteries: Normal. --Inferior cerebellar arteries: Normal variant pattern with the right vertebral artery terminating in PICA. VENOUS SINUSES: As permitted by contrast timing, patent. ANATOMIC VARIANTS: None DELAYED PHASE: No parenchymal contrast enhancement. Review of the MIP images confirms the above findings. IMPRESSION: No emergent large vessel occlusion or hemodynamically significant stenosis of the head or neck. Electronically Signed   By: Deatra Robinson M.D.   On: 06/01/2018 01:09   Mr Brain Wo Contrast  Result Date: 05/31/2018 CLINICAL DATA:  Ataxia. Stroke suspected. Focal neuro deficit of greater than 6 hours. Stroke suspected. Generalized weakness and dizziness. EXAM: MRI HEAD WITHOUT CONTRAST TECHNIQUE: Multiplanar, multiecho pulse sequences of the brain and  surrounding structures were obtained without intravenous contrast. COMPARISON:  None. FINDINGS: Brain: Acute nonhemorrhagic left paramedian pontine infarct measures 18 mm anterior-posterior is subtle T2 signal changes are associated with the acute infarct. There is a remote lacunar infarct involving the left internal capsule. Minimal scattered subcortical T2 hyperintensities otherwise are within normal limits for age. No acute hemorrhage or mass lesion is present. The internal auditory canals are within normal limits bilaterally. Cerebellum is within normal limits. Ventricles are proportionate to the degree of atrophy. No significant extra-axial fluid collection is present. Vascular: Flow is present in the major intracranial arteries. Skull and upper cervical spine: The skull base is within normal limits. Craniocervical junction is normal. Remote endplate changes and loss of height is present at C3 and C4. Spinal canal is patent. Sinuses/Orbits: Mild mucosal thickening is present throughout the ethmoid air cells. Paranasal sinuses and mastoid air cells are otherwise clear. Globes and orbits are within normal limits bilaterally. IMPRESSION: 1. Acute nonhemorrhagic left paramedian pontine infarct. 2. Remote punctate lacunar infarct of the left internal capsule. 3. Degenerative changes of the upper cervical spine chronic endplate changes and some loss of height. These results were called by telephone at the time of interpretation on 05/31/2018 at 12:47 pm to Dr. Gerhard Munch , who verbally acknowledged these results. Electronically Signed   By: Marin Roberts M.D.   On: 05/31/2018 12:51   Labs: Basic Metabolic Panel: Recent Labs  Lab 05/31/18 1114  NA 142  K 3.7  CL 109  CO2 25  GLUCOSE 142*  BUN 15  CREATININE 1.40*  CALCIUM 9.1   Liver Function Tests: Recent Labs  Lab 05/31/18 1114  AST  14*  ALT 13  ALKPHOS 57  BILITOT 0.8  PROT 6.4*  ALBUMIN 3.6   CBC: Recent Labs  Lab  05/31/18 1114  WBC 4.2  NEUTROABS 2.7  HGB 13.5  HCT 43.3  MCV 82.8  PLT 237   CBG: Recent Labs  Lab 06/01/18 1152 06/01/18 1616 06/01/18 2209 06/02/18 0613 06/02/18 1124  GLUCAP 131* 116* 135* 146* 138*    Principal Problem:   CVA (cerebral vascular accident) (HCC) Active Problems:   Hypertension   DM2 (diabetes mellitus, type 2) (HCC)   Hyperlipidemia   Time coordinating discharge: 20 minutes  Signed:  Brendia Sacks, MD [facility] 06/02/2018, 5:14 PM

## 2018-07-07 ENCOUNTER — Encounter: Payer: Self-pay | Admitting: Adult Health

## 2018-07-07 ENCOUNTER — Ambulatory Visit (INDEPENDENT_AMBULATORY_CARE_PROVIDER_SITE_OTHER): Payer: Medicare Other | Admitting: Adult Health

## 2018-07-07 VITALS — BP 143/80 | HR 53 | Ht 69.0 in | Wt 201.4 lb

## 2018-07-07 DIAGNOSIS — E785 Hyperlipidemia, unspecified: Secondary | ICD-10-CM | POA: Diagnosis not present

## 2018-07-07 DIAGNOSIS — I63512 Cerebral infarction due to unspecified occlusion or stenosis of left middle cerebral artery: Secondary | ICD-10-CM | POA: Diagnosis not present

## 2018-07-07 DIAGNOSIS — E1159 Type 2 diabetes mellitus with other circulatory complications: Secondary | ICD-10-CM

## 2018-07-07 DIAGNOSIS — I10 Essential (primary) hypertension: Secondary | ICD-10-CM

## 2018-07-07 NOTE — Patient Instructions (Signed)
Continue clopidogrel 75 mg daily  and lipitor 40mg   for secondary stroke prevention  Continue to follow up with PCP regarding cholesterol, blood pressure and diabetes management   Continue to use CPAP for sleep apnea management  Continue to stay active and maintain a healthy diet  Continue to monitor blood pressure at home  Maintain strict control of hypertension with blood pressure goal below 130/90, diabetes with hemoglobin A1c goal below 6.5% and cholesterol with LDL cholesterol (bad cholesterol) goal below 70 mg/dL. I also advised the patient to eat a healthy diet with plenty of whole grains, cereals, fruits and vegetables, exercise regularly and maintain ideal body weight.  Followup in the future with me in 6 months or call earlier if needed       Thank you for coming to see [place] at [facility]. I hope we have been able to provide you high quality care today.  You may receive a patient satisfaction survey over the next few weeks. We would appreciate your feedback and comments so that we may continue to improve ourselves and the health of our patients.

## 2018-07-07 NOTE — Progress Notes (Signed)
[facility] [address] [place]. [place] [address] [phone]       OFFICE FOLLOW UP NOTE  Mr. Christian Mclaughlin Date of Birth:  06-Jul-1946 Medical Record Number:  [id number]   Reason for Referral:  hospital stroke follow up  CHIEF COMPLAINT:  Chief Complaint  Patient presents with  . Follow-up    Follow up fro hospital stroke seen by Dr. 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 History obtained from patient and chart review. Reviewed all radiology images and labs personally.  Christian Mclaughlin is a 72 year old male with PMH of DM, HTN, OSA on CPAP and DM who presented to [facility] ED on 05/31/2018 with ataxia and lightheadedness that started on 05/27/2018 with dizziness sensation with daily episodes and one episode consisting of speech difficulties.  He was noted to have slurred speech upon arrival along with BG 64 and light headache.  Per notes, denied any drug abuse, current smoking history, numbness, tingling, focal weakness, vision problems or facial droop. MRI of the brain reviewed and showed acute nonhemorrhagic left paramedian pontine infarct along with remote punctate lacunar infarct of the left internal capsule.  CTA head and neck was negative for emergent large vessel occlusion or hemodynamically significant stenosis of the head and neck.    2D echo showed an EF of 55 to 60% without evidence of PFO.  LDL 101 and recommended atorvastatin 40 mg daily.  A1c 8.3 and recommended tight glycemic control with close PCP follow-up for DM management.  Initially elevated SBP from 1 30-1 50s during admission and stabilized throughout with long-term BP goal normotensive range.  Spell as though stroke likely due to small vessel disease given uncontrolled risk factors of DM, HTN and HLD.  Recommended for DAPT for 3 weeks and Plavix alone as patient  was previously on aspirin 81 mg.  Patient was discharged home in stable condition without therapy needs.  Patient is being seen today for hospital follow-up.  He states overall he continues to do well without residual deficits or recurring of symptoms.  He has completed 3 weeks DAPT and currently remains on Plavix without side effects of bleeding or bruising.  He continues to take Lipitor 40 mg without side effects of myalgias.  Blood pressure today 143/80.  Glucose levels at home have been fluctuating as he has recently stopped using his Lantus for the past 4 to 5 days due to hypoglycemia in the morning.  He states since stopping his Lantus dose in the evening, his glucose levels have been 10 9-1 16 in the morning.  He does continue to be followed by the [facility] for management.  He also does endorse changing his eating habits since his hospital admission.  He does have a history of OSA for which she is compliant with his CPAP.  He has returned back to all prior activities without complications.  Denies new or worsening stroke/TIA symptoms.     ROS:   14 system review of systems performed and negative with exception of loss of vision, snoring, urination problems, joint pain  PMH:  Past Medical History:  Diagnosis Date  . Diabetes mellitus without complication (HCC)   . GERD (gastroesophageal reflux disease)   . Hypertension   . Prostate disease   . Sleep apnea   . Stroke [facility])     PSH:  History reviewed. No pertinent surgical history.  Social History:  Social History   Socioeconomic History  . Marital status: Married    Spouse name: Not on file  . Number of children: Not on file  . Years of education: Not on file  . Highest education level: Not on file  Occupational History  . Not on file  Social Needs  . Financial resource strain: Not on file  . Food insecurity:    Worry: Not on file    Inability: Not on file  . Transportation needs:    Medical: Not on file    Non-medical: Not on  file  Tobacco Use  . Smoking status: Never Smoker  . Smokeless tobacco: Never Used  Substance and Sexual Activity  . Alcohol use: No    Frequency: Never  . Drug use: Not Currently  . Sexual activity: Not on file  Lifestyle  . Physical activity:    Days per week: Not on file    Minutes per session: Not on file  . Stress: Not on file  Relationships  . Social connections:    Talks on phone: Not on file    Gets together: Not on file    Attends religious service: Not on file    Active member of club or organization: Not on file    Attends meetings of clubs or organizations: Not on file    Relationship status: Not on file  . Intimate partner violence:    Fear of current or ex partner: Not on file    Emotionally abused: Not on file    Physically abused: Not on file    Forced sexual activity: Not on file  Other Topics Concern  . Not on file  Social History Narrative  . Not on file    Family History: History reviewed. No pertinent family history.  Medications:   Current Outpatient Medications on File Prior to Visit  Medication Sig Dispense Refill  . acetaminophen (TYLENOL) 500 MG tablet Take 1,000 mg by mouth every 6 (six) hours as needed for mild pain or headache.    Marland Kitchen amLODipine (NORVASC) 10 MG tablet Take 10 mg by mouth daily.    Marland Kitchen atorvastatin (LIPITOR) 40 MG tablet Take 1 tablet (40 mg total) by mouth daily at 6 PM. 30 tablet 0  . clopidogrel (PLAVIX) 75 MG tablet Take 1 tablet (75 mg total) by mouth daily. 30 tablet 0  . finasteride (PROSCAR) 5 MG tablet Take 5 mg by mouth daily.    . insulin glargine (LANTUS) 100 UNIT/ML injection Inject 32 Units into the skin.     Marland Kitchen lisinopril (PRINIVIL,ZESTRIL) 10 MG tablet Take 10 mg by mouth daily.     . metFORMIN (GLUCOPHAGE-XR) 500 MG 24 hr tablet Take 500 mg by mouth daily with breakfast.    . pantoprazole (PROTONIX) 20 MG tablet Take 20 mg by mouth daily.    Marland Kitchen terazosin (HYTRIN) 10 MG capsule      No current  facility-administered medications on file prior to visit.     Allergies:  No Known Allergies   Physical Exam  Vitals:   07/07/18 0835  BP: (!) 143/80  Pulse: (!) 53  Weight: 201 lb 6.4 oz (91.4 kg)  Height: 5\' 9"  (1.753 m)   Body mass index is 29.74 kg/m. No exam data present  General: well developed, well nourished, pleasant elderly African-American male, seated, in no evident distress Head: head normocephalic and atraumatic.   Neck: supple with no carotid or  supraclavicular bruits Cardiovascular: regular rate and rhythm, no murmurs Musculoskeletal: no deformity Skin:  no rash/petichiae Vascular:  Normal pulses all extremities  Neurologic Exam Mental Status: Awake and fully alert. Oriented to place and time. Recent and remote memory intact. Attention span, concentration and fund of knowledge appropriate. Mood and affect appropriate.  Cranial Nerves: Fundoscopic exam reveals sharp disc margins. Pupils equal, briskly reactive to light. Extraocular movements full without nystagmus. Visual fields full to confrontation. Hearing intact. Facial sensation intact. Face, tongue, palate moves normally and symmetrically.  Motor: Normal bulk and tone. Normal strength in all tested extremity muscles. Sensory.: intact to touch , pinprick , position and vibratory sensation.  Coordination: Rapid alternating movements normal in all extremities. Finger-to-nose and heel-to-shin performed accurately bilaterally. Gait and Station: Arises from chair without difficulty. Stance is normal. Gait demonstrates normal stride length and balance . Able to heel, toe and tandem walk without difficulty.  Reflexes: 1+ and symmetric. Toes downgoing.    NIHSS  0 Modified Rankin  0    Diagnostic Data (Labs, Imaging, Testing)  MR BRAIN WO CONTRAST 05/31/2017 IMPRESSION: 1. Acute nonhemorrhagic left paramedian pontine infarct. 2. Remote punctate lacunar infarct of the left internal capsule. 3. Degenerative  changes of the upper cervical spine chronic endplate changes and some loss of height.  CT ANGIO HEAD W OR WO CONTRAST CT ANGIO NECK W OR WO CONTRAST 06/01/2018 IMPRESSION: No emergent large vessel occlusion or hemodynamically significant stenosis of the head or neck.  ECHOCARDIOGRAM 06/01/2018 Study Conclusions - Left ventricle: The cavity size was normal. Wall thickness was   increased in a pattern of mild LVH. Systolic function was normal.   The estimated ejection fraction was in the range of 55% to 60%.   Wall motion was normal; there were no regional wall motion   abnormalities. Doppler parameters are consistent with abnormal   left ventricular relaxation (grade 1 diastolic dysfunction).   Doppler parameters are consistent with high ventricular filling   pressure. - Aortic valve: Transvalvular velocity was within the normal range.   There was no stenosis. There was no regurgitation. - Mitral valve: Transvalvular velocity was within the normal range.   There was no evidence for stenosis. There was trivial   regurgitation. - Left atrium: The atrium was mildly dilated. - Right ventricle: The cavity size was normal. Wall thickness was   normal. Systolic function was normal. - Atrial septum: No defect or patent foramen ovale was identified. - Tricuspid valve: There was no regurgitation.   ASSESSMENT: Christian Mclaughlin is a 72 y.o. year old male here with left paramedian pontine infarct on 05/31/2018 secondary to small vessel disease. Vascular risk factors include uncontrolled HTN, HLD and DM along with OSA with CPAP.  Patient is being seen today for stroke follow-up and overall is doing well without residual deficits or recurring of symptoms.    PLAN: -Continue clopidogrel 75 mg daily  and atorvastatin 40 mg for secondary stroke prevention -F/u with PCP regarding your HLD, HTN and DM management -Continue compliance with CPAP for OSA management -continue to monitor BP at  home -advised to continue to stay active and maintain a healthy diet -Maintain strict control of hypertension with blood pressure goal below 130/90, diabetes with hemoglobin A1c goal below 6.5% and cholesterol with LDL cholesterol (bad cholesterol) goal below 70 mg/dL. I also advised the patient to eat a healthy diet with plenty of whole grains, cereals, fruits and vegetables, exercise regularly and maintain ideal body weight.  Follow up  in 6 months or call earlier if needed   Greater than 50% of time during this 25 minute visit was spent on counseling,explanation of diagnosis of left paramedian pontine infarct, reviewing risk factor management of SVT, HTN, HLD, DM and OSA, planning of further management, discussion with patient and family and coordination of care    George Hugh, AGNP-BC  [facility] [address] [place], [place] [address]  Phone [phone] Fax [phone] Note: This document was prepared with digital dictation and possible smart phrase technology. Any transcriptional errors that result from this process are unintentional.

## 2018-07-07 NOTE — Progress Notes (Signed)
I agree with the above plan 

## 2018-12-28 ENCOUNTER — Telehealth: Payer: Self-pay | Admitting: Adult Health

## 2018-12-28 NOTE — Telephone Encounter (Addendum)
lvm for pt to call back to discuss virtual visit

## 2019-01-06 ENCOUNTER — Ambulatory Visit: Payer: Medicare Other | Admitting: Adult Health

## 2019-06-17 ENCOUNTER — Other Ambulatory Visit: Payer: Self-pay | Admitting: Urology

## 2019-07-19 ENCOUNTER — Encounter (HOSPITAL_BASED_OUTPATIENT_CLINIC_OR_DEPARTMENT_OTHER): Payer: Self-pay

## 2019-07-19 ENCOUNTER — Ambulatory Visit (HOSPITAL_BASED_OUTPATIENT_CLINIC_OR_DEPARTMENT_OTHER): Admit: 2019-07-19 | Payer: TRICARE For Life (TFL) | Admitting: Urology

## 2019-07-19 SURGERY — CYST REMOVAL
Anesthesia: General

## 2019-12-08 ENCOUNTER — Emergency Department (HOSPITAL_BASED_OUTPATIENT_CLINIC_OR_DEPARTMENT_OTHER): Payer: Medicare Other

## 2019-12-08 ENCOUNTER — Emergency Department (HOSPITAL_BASED_OUTPATIENT_CLINIC_OR_DEPARTMENT_OTHER)
Admission: EM | Admit: 2019-12-08 | Discharge: 2019-12-08 | Disposition: A | Discharge status: Home / Self Care | Payer: Medicare Other | Attending: Emergency Medicine | Admitting: Emergency Medicine

## 2019-12-08 ENCOUNTER — Other Ambulatory Visit: Payer: Self-pay

## 2019-12-08 ENCOUNTER — Encounter (HOSPITAL_BASED_OUTPATIENT_CLINIC_OR_DEPARTMENT_OTHER): Payer: Self-pay

## 2019-12-08 DIAGNOSIS — E119 Type 2 diabetes mellitus without complications: Secondary | ICD-10-CM | POA: Diagnosis not present

## 2019-12-08 DIAGNOSIS — L03119 Cellulitis of unspecified part of limb: Secondary | ICD-10-CM

## 2019-12-08 DIAGNOSIS — L03114 Cellulitis of left upper limb: Secondary | ICD-10-CM | POA: Diagnosis not present

## 2019-12-08 DIAGNOSIS — I10 Essential (primary) hypertension: Secondary | ICD-10-CM | POA: Diagnosis not present

## 2019-12-08 DIAGNOSIS — E785 Hyperlipidemia, unspecified: Secondary | ICD-10-CM | POA: Diagnosis not present

## 2019-12-08 DIAGNOSIS — M79645 Pain in left finger(s): Secondary | ICD-10-CM | POA: Diagnosis present

## 2019-12-08 DIAGNOSIS — Z8673 Personal history of transient ischemic attack (TIA), and cerebral infarction without residual deficits: Secondary | ICD-10-CM | POA: Diagnosis not present

## 2019-12-08 LAB — CBC WITH DIFFERENTIAL/PLATELET
Abs Immature Granulocytes: 0.02 10*3/uL (ref 0.00–0.07)
Basophils Absolute: 0 10*3/uL (ref 0.0–0.1)
Basophils Relative: 0 %
Eosinophils Absolute: 0.1 10*3/uL (ref 0.0–0.5)
Eosinophils Relative: 2 %
HCT: 42 % (ref 39.0–52.0)
Hemoglobin: 13.3 g/dL (ref 13.0–17.0)
Immature Granulocytes: 0 %
Lymphocytes Relative: 27 %
Lymphs Abs: 1.7 10*3/uL (ref 0.7–4.0)
MCH: 25.7 pg — ABNORMAL LOW (ref 26.0–34.0)
MCHC: 31.7 g/dL (ref 30.0–36.0)
MCV: 81.1 fL (ref 80.0–100.0)
Monocytes Absolute: 0.6 10*3/uL (ref 0.1–1.0)
Monocytes Relative: 9 %
Neutro Abs: 3.9 10*3/uL (ref 1.7–7.7)
Neutrophils Relative %: 62 %
Platelets: 215 10*3/uL (ref 150–400)
RBC: 5.18 MIL/uL (ref 4.22–5.81)
RDW: 15.6 % — ABNORMAL HIGH (ref 11.5–15.5)
WBC: 6.3 10*3/uL (ref 4.0–10.5)
nRBC: 0 % (ref 0.0–0.2)

## 2019-12-08 LAB — BASIC METABOLIC PANEL
Anion gap: 8 (ref 5–15)
BUN: 20 mg/dL (ref 8–23)
CO2: 24 mmol/L (ref 22–32)
Calcium: 9 mg/dL (ref 8.9–10.3)
Chloride: 105 mmol/L (ref 98–111)
Creatinine, Ser: 1.65 mg/dL — ABNORMAL HIGH (ref 0.61–1.24)
GFR calc Af Amer: 48 mL/min — ABNORMAL LOW (ref >60)
GFR calc non Af Amer: 41 mL/min — ABNORMAL LOW (ref >60)
Glucose, Bld: 149 mg/dL — ABNORMAL HIGH (ref 70–99)
Potassium: 4 mmol/L (ref 3.5–5.1)
Sodium: 137 mmol/L (ref 135–145)

## 2019-12-08 MED ORDER — DOXYCYCLINE HYCLATE 100 MG PO TABS
100.0000 mg | ORAL_TABLET | Freq: Once | ORAL | Status: AC
  Administered 2019-12-08: 100 mg via ORAL
  Filled 2019-12-08: qty 1

## 2019-12-08 MED ORDER — DOXYCYCLINE HYCLATE 100 MG PO CAPS: 100.0000 mg | ORAL_CAPSULE | Freq: Two times a day (BID) | ORAL | 0 refills | Status: AC

## 2019-12-08 NOTE — ED Triage Notes (Signed)
Pt noticed L ring finger injury x 2 days ago, suspected a splinter and attempted to remove it. Pt states swelling is moving up hand. Swelling and redness noted to finger.

## 2019-12-08 NOTE — Discharge Instructions (Signed)
You were seen in the emergency department today with infection in the left hand.  Your lab work and x-rays are reassuring.  I have given you antibiotic which you need to take as prescribed.  Please complete the full course of antibiotic.  I would like for your hand to be reevaluated in 2 days either at your PCP office or here in the emergency department.  Return sooner if you develop fever, swelling up the forearm, or worsening pain in the finger.

## 2019-12-08 NOTE — ED Provider Notes (Signed)
Emergency Department Provider Note   I have reviewed the triage vital signs and the nursing notes.   HISTORY  Chief Complaint Finger Injury   HPI Christian Mclaughlin is a 74 y.o. male with PMH of HTN and DM presents emergency department with left ring finger pain, swelling, redness.  Symptoms began 2 days ago and gradually worsened.  He initially thought there was a splinter in the area and tried to remove it.  He tells me that the skin "came to a head" and then there was some mild drainage.  He has been swelling in the finger which has since extended into the top of his left hand.  No redness or swelling into the arm.  He is not experiencing fevers.  He is able to move the fingers with some tightness noted. No weakness/numbness. No pain or swelling at the tip of the finger.   Past Medical History:  Diagnosis Date  . Diabetes mellitus without complication ([place])   . GERD (gastroesophageal reflux disease)   . Hypertension   . Prostate disease   . Sleep apnea   . Stroke [facility])     Patient Active Problem List   Diagnosis Date Noted  . CVA (cerebral vascular accident) ([place]) 05/31/2018  . Hypertension 05/31/2018  . DM2 (diabetes mellitus, type 2) ([place]) 05/31/2018  . Hyperlipidemia 05/31/2018    History reviewed. No pertinent surgical history.  Allergies Patient has no known allergies.  History reviewed. No pertinent family history.  Social History Social History   Tobacco Use  . Smoking status: Never Smoker  . Smokeless tobacco: Never Used  Substance Use Topics  . Alcohol use: Yes    Comment: occ  . Drug use: Not Currently    Review of Systems  Constitutional: No fever/chills Eyes: No visual changes. ENT: No sore throat. Cardiovascular: Denies chest pain. Respiratory: Denies shortness of breath. Gastrointestinal: No abdominal pain.  No nausea, no vomiting.  No diarrhea.  No constipation. Musculoskeletal: Negative for back pain. Skin: Left ring finger swelling  and pain with redness.  Neurological: Negative for headaches, focal weakness or numbness.  10-point ROS otherwise negative.  ____________________________________________   PHYSICAL EXAM:  VITAL SIGNS: ED Triage Vitals  Enc Vitals Group     BP 12/08/19 2022 (!) 151/91     Pulse Rate 12/08/19 2022 65     Resp 12/08/19 2022 17     Temp 12/08/19 2022 98.1 F (36.7 C)     Temp Source 12/08/19 2022 Oral     SpO2 12/08/19 2022 98 %     Weight 12/08/19 2023 203 lb (92.1 kg)     Height 12/08/19 2023 5\' 9"  (1.753 m)   Constitutional: Alert and oriented. Well appearing and in no acute distress. Eyes: Conjunctivae are normal. Head: Atraumatic. Nose: No congestion/rhinnorhea. Mouth/Throat: Mucous membranes are moist.  Neck: No stridor.   Cardiovascular: Normal rate, regular rhythm. Good peripheral circulation. Grossly normal heart sounds.   Respiratory: Normal respiratory effort.  No retractions. Lungs CTAB. Gastrointestinal: Soft and nontender. No distention.  Musculoskeletal: Left ring finger with mild edema and erythema.  He has normal range of motion.  No tenderness at the fingertip to suspect felon.  There is no drainable abscess.  No tenderness over the flexor compartment of the hand or forearm.  Neurologic:  Normal speech and language. Skin:  Skin is warm and dry.  Small area of skin breakdown over the left ring finger without active drainage or abscess.  ____________________________________________   LABS (all labs ordered are listed, but only abnormal results are displayed)  Labs Reviewed  BASIC METABOLIC PANEL - Abnormal; Notable for the following components:      Result Value   Glucose, Bld 149 (*)    Creatinine, Ser 1.65 (*)    GFR calc non Af Amer 41 (*)    GFR calc Af Amer 48 (*)    All other components within normal limits  CBC WITH DIFFERENTIAL/PLATELET - Abnormal; Notable for the following components:   MCH 25.7 (*)    RDW 15.6 (*)    All other  components within normal limits   ____________________________________________  RADIOLOGY  DG Finger Ring Left  Result Date: 12/08/2019 CLINICAL DATA:  Pain and swelling. Potential foreign body. EXAM: LEFT RING FINGER 2+V COMPARISON:  None. FINDINGS: There is asymmetric soft tissue swelling about the left fourth digit. There is no radiopaque foreign body. No underlying acute displaced fracture or dislocation. No definite radiographic evidence for osteomyelitis. IMPRESSION: Soft tissue swelling about the left fourth digit without radiopaque foreign body or underlying osseous abnormality. Electronically Signed   By: Katherine Mantle M.D.   On: 12/08/2019 20:42    ____________________________________________   PROCEDURES  Procedure(s) performed:   Procedures  None  ____________________________________________   INITIAL IMPRESSION / ASSESSMENT AND PLAN / ED COURSE  Pertinent labs & imaging results that were available during my care of the patient were reviewed by me and considered in my medical decision making (see chart for details).   Patient presents to the emergency department with left ring finger swelling with erythema.  There is no drainable abscess clinically.  There is a small area of skin breakdown only for possible source of cellulitis.  The presentation here is mild.  Patient has no flexor tendon tenderness.  No felon.  Mild swelling at the dorsum of the left hand but erythema is not extending there.  Labs obtained showing no leukocytosis and baseline renal insufficiency.  Plan for doxycycline coverage and wound reevaluation in 48 hours either with the PCP or return to the emergency department.  I discussed signs/symptoms that would require reevaluation sooner such as fever, worsening pain, or swelling/redness extending up into the forearm. Patient verbalizes understanding and f/u plan. Will call PCP in the AM.    ____________________________________________  FINAL CLINICAL  IMPRESSION(S) / ED DIAGNOSES  Final diagnoses:  Cellulitis of hand     MEDICATIONS GIVEN DURING THIS VISIT:  Medications  doxycycline (VIBRA-TABS) tablet 100 mg (100 mg Oral Given 12/08/19 2124)     NEW OUTPATIENT MEDICATIONS STARTED DURING THIS VISIT:  Discharge Medication List as of 12/08/2019  9:17 PM    START taking these medications   Details  doxycycline (VIBRAMYCIN) 100 MG capsule Take 1 capsule (100 mg total) by mouth 2 (two) times daily for 7 days., Starting Wed 12/08/2019, Until Wed 12/15/2019, Normal        Note:  This document was prepared using Dragon voice recognition software and may include unintentional dictation errors.  Alona Bene, MD, [facility]    Arie Powell, Arlyss Repress, MD 12/08/19 [phone]

## 2020-07-24 IMAGING — MR MR HEAD W/O CM
9 of 10 series · 35 of 48 positions shown · non-contrast
Comparison: None.

CLINICAL DATA: Ataxia. Stroke suspected. Focal neuro deficit of
greater than 6 hours. Stroke suspected. Generalized weakness and
dizziness.

EXAM:
MRI HEAD WITHOUT CONTRAST
TECHNIQUE: Multiplanar, multiecho pulse sequences of the brain and surrounding
structures were obtained without intravenous contrast.

[Series 3: DWI · axial · 3.0mm · 0.94mm/px · z∈[-56,+89]mm · 8 of 100 slices shown (1 of 2)]
[im 1/100]
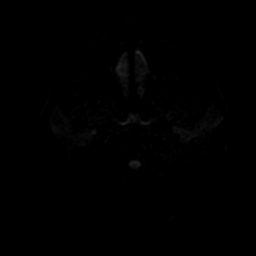
[im 12/100]
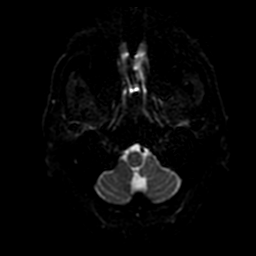
[im 34/100]
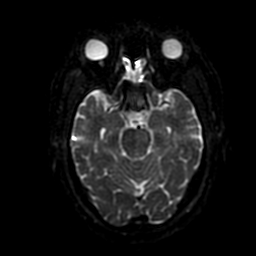
[im 45/100]
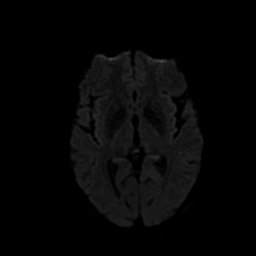
[im 56/100]
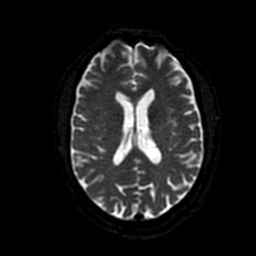
[im 67/100]
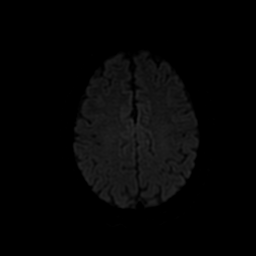
[im 89/100]
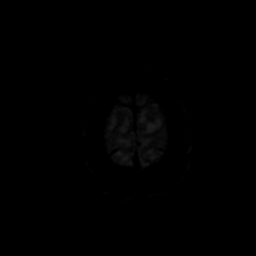
[im 100/100]
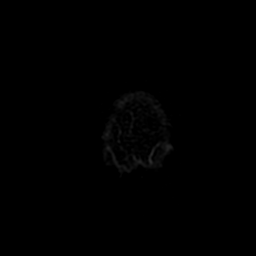

[Series 4: DWI · coronal · 4.0mm · 0.94mm/px · 7 of 76 slices shown (2 of 2)]
[im 1/76]
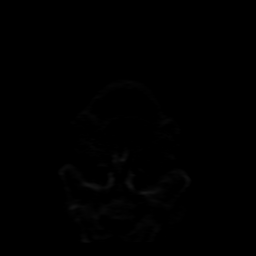
[im 13/76]
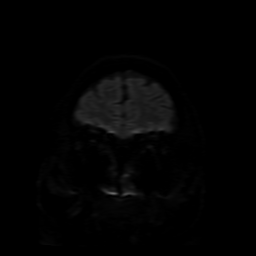
[im 26/76]
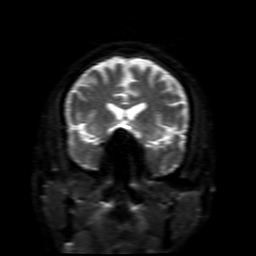
[im 38/76]
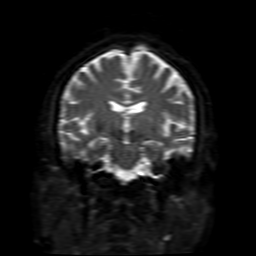
[im 51/76]
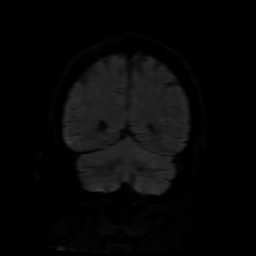
[im 63/76]
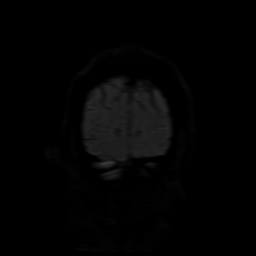
[im 76/76]
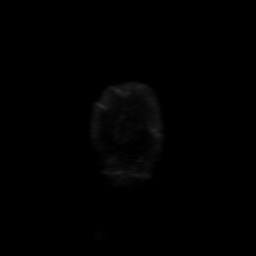

[Series 5: FLAIR · sagittal · 5.0mm · 0.47mm/px · 2 of 27 slices shown (1 of 2)]
[im 1/27]
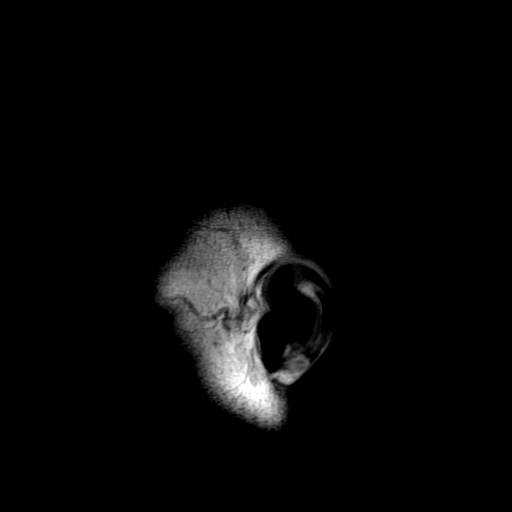
[im 27/27]
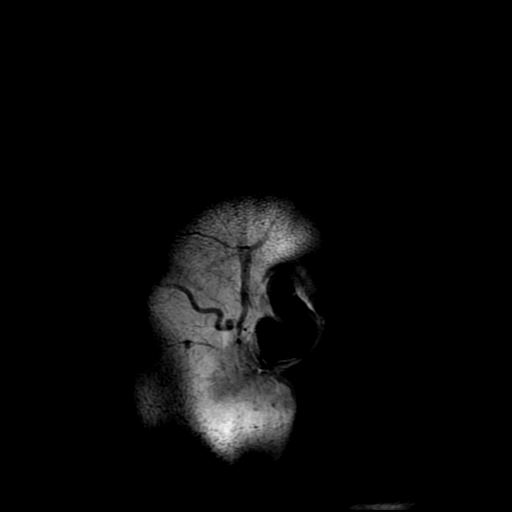

[Series 6: T2 · axial · 5.0mm · 0.47mm/px · z∈[-55,+87]mm · 2 of 25 slices shown (1 of 2)]
[im 1/25]
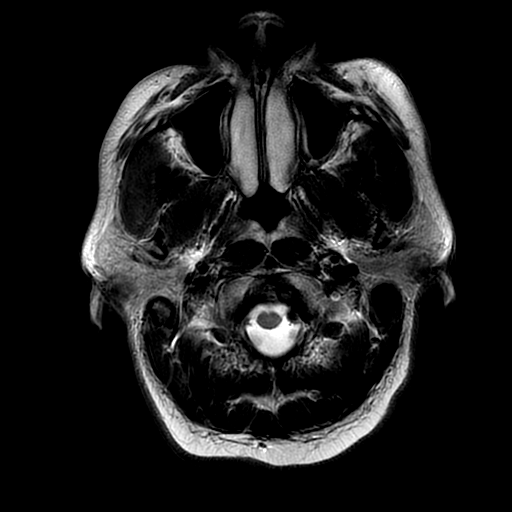
[im 25/25]
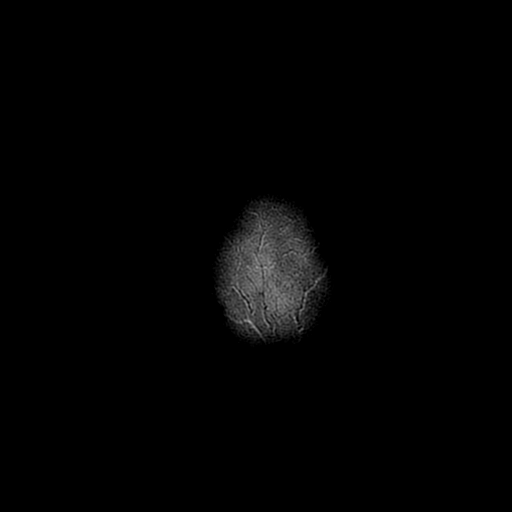

[Series 7: FLAIR · axial · 3.0mm · 0.47mm/px · z∈[-55,+87]mm · 2 of 25 slices shown (2 of 2)]
[im 1/25]
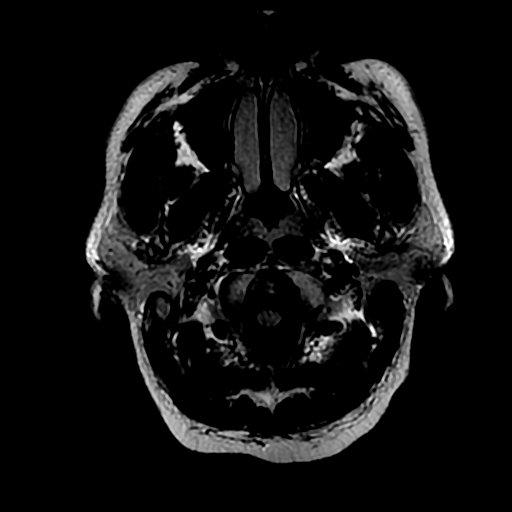
[im 25/25]
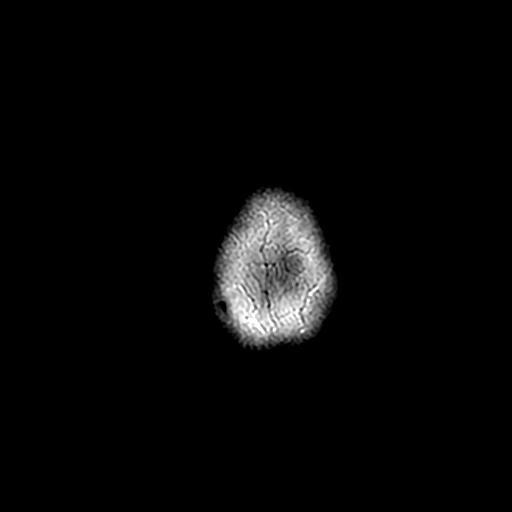

[Series 8: (person_name) · axial · 3.0mm · 0.47mm/px · z∈[-56,-21]mm · 3 of 100 slices shown]
[im 1/100]
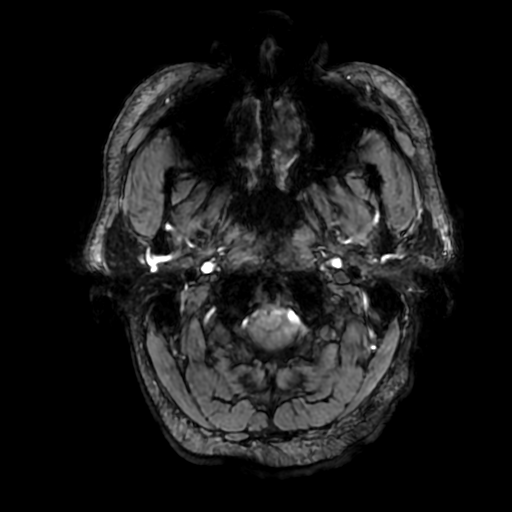
[im 13/100]
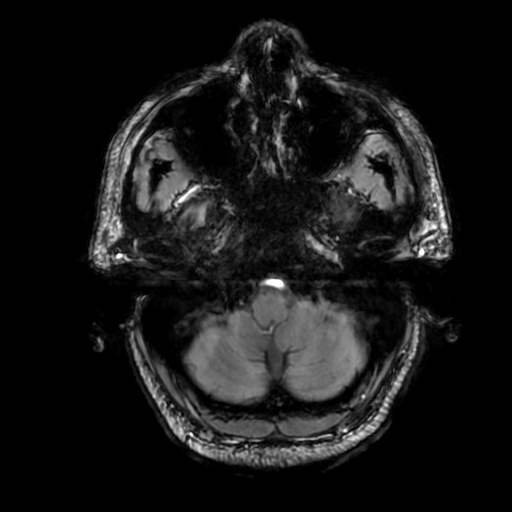
[im 25/100]
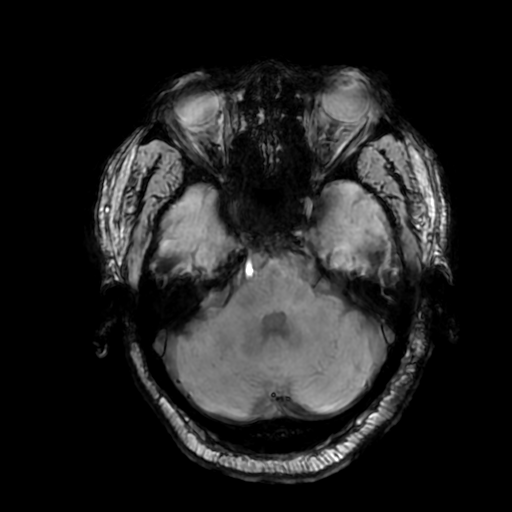

[Series 10: T2 · coronal · 5.0mm · 0.47mm/px · 3 of 32 slices shown (2 of 2)]
[im 1/32]
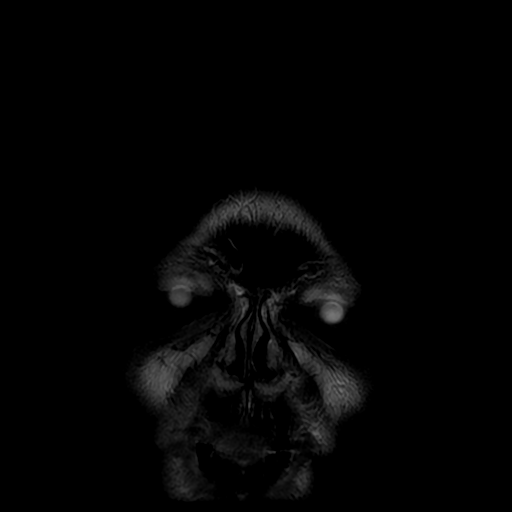
[im 16/32]
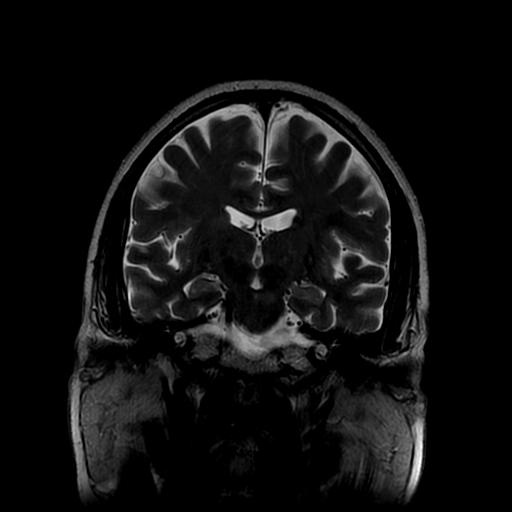
[im 32/32]
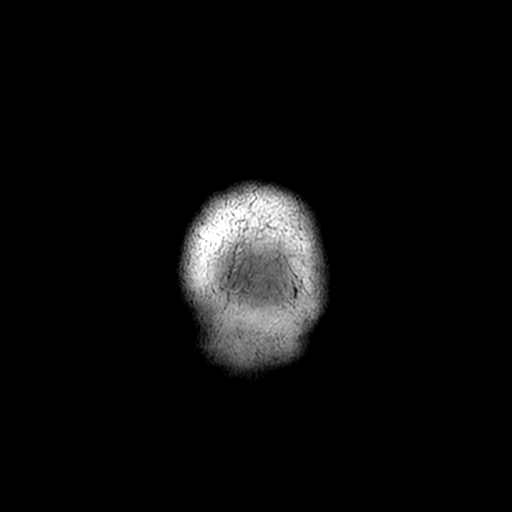

[Series 350: ADC · axial · 3.0mm · 0.94mm/px · z∈[-56,+89]mm · 5 of 50 slices shown (1 of 2)]
[im 1/50]
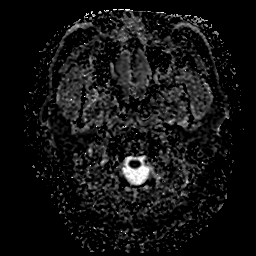
[im 13/50]
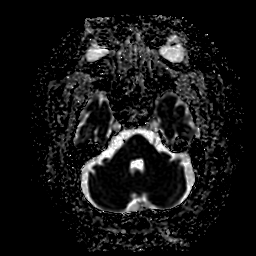
[im 25/50]
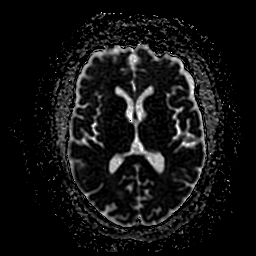
[im 37/50]
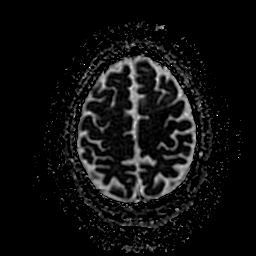
[im 50/50]
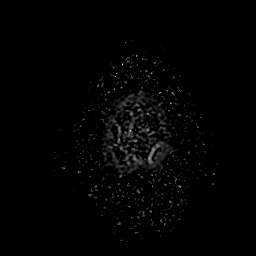

[Series 450: ADC · coronal · 4.0mm · 0.94mm/px · 3 of 38 slices shown (2 of 2)]
[im 1/38]
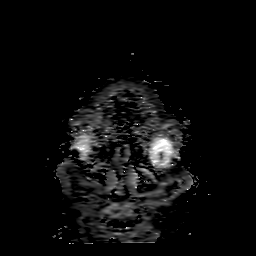
[im 19/38]
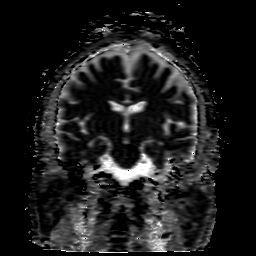
[im 38/38]
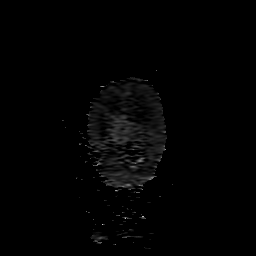

[35 of 48 positions shown; findings below may reference images not displayed]

FINDINGS: Brain: Acute nonhemorrhagic left paramedian pontine infarct measures
18 mm anterior-posterior is subtle T2 signal changes are associated
with the acute infarct. There is a remote lacunar infarct involving
the left internal capsule. Minimal scattered subcortical T2
hyperintensities otherwise are within normal limits for age.

No acute hemorrhage or mass lesion is present. The internal auditory
canals are within normal limits bilaterally. Cerebellum is within
normal limits.

Ventricles are proportionate to the degree of atrophy. No
significant extra-axial fluid collection is present.

Vascular: Flow is present in the major intracranial arteries.

Skull and upper cervical spine: The skull base is within normal
limits. Craniocervical junction is normal. Remote endplate changes
and loss of height is present at C3 and C4. Spinal canal is patent.

Sinuses/Orbits: Mild mucosal thickening is present throughout the
ethmoid air cells. Paranasal sinuses and mastoid air cells are
otherwise clear. Globes and orbits are within normal limits
bilaterally.
IMPRESSION: 1. Acute nonhemorrhagic left paramedian pontine infarct.
2. Remote punctate lacunar infarct of the left internal capsule.
3. Degenerative changes of the upper cervical spine chronic endplate
changes and some loss of height.

These results were called by telephone at the time of interpretation
on 05/31/2018 at [DATE] to Dr. AUNTYJATTY DELOWR , who verbally
acknowledged these results.

## 2023-04-24 DIAGNOSIS — C61 Malignant neoplasm of prostate: Secondary | ICD-10-CM

## 2023-04-24 HISTORY — DX: Malignant neoplasm of prostate: C61

## 2023-05-07 ENCOUNTER — Other Ambulatory Visit: Payer: Self-pay

## 2023-05-07 ENCOUNTER — Encounter (HOSPITAL_BASED_OUTPATIENT_CLINIC_OR_DEPARTMENT_OTHER): Payer: Self-pay | Admitting: Emergency Medicine

## 2023-05-07 ENCOUNTER — Emergency Department (HOSPITAL_BASED_OUTPATIENT_CLINIC_OR_DEPARTMENT_OTHER)
Admission: EM | Admit: 2023-05-07 | Discharge: 2023-05-07 | Disposition: A | Discharge status: Home / Self Care | Payer: Medicare Other | Attending: Emergency Medicine | Admitting: Emergency Medicine

## 2023-05-07 ENCOUNTER — Emergency Department (HOSPITAL_BASED_OUTPATIENT_CLINIC_OR_DEPARTMENT_OTHER): Payer: Medicare Other

## 2023-05-07 DIAGNOSIS — Z7902 Long term (current) use of antithrombotics/antiplatelets: Secondary | ICD-10-CM | POA: Insufficient documentation

## 2023-05-07 DIAGNOSIS — N3001 Acute cystitis with hematuria: Secondary | ICD-10-CM | POA: Diagnosis not present

## 2023-05-07 DIAGNOSIS — Z8673 Personal history of transient ischemic attack (TIA), and cerebral infarction without residual deficits: Secondary | ICD-10-CM | POA: Diagnosis not present

## 2023-05-07 DIAGNOSIS — Z794 Long term (current) use of insulin: Secondary | ICD-10-CM | POA: Insufficient documentation

## 2023-05-07 DIAGNOSIS — D72829 Elevated white blood cell count, unspecified: Secondary | ICD-10-CM | POA: Diagnosis not present

## 2023-05-07 DIAGNOSIS — E119 Type 2 diabetes mellitus without complications: Secondary | ICD-10-CM | POA: Insufficient documentation

## 2023-05-07 DIAGNOSIS — B356 Tinea cruris: Secondary | ICD-10-CM | POA: Diagnosis not present

## 2023-05-07 DIAGNOSIS — I10 Essential (primary) hypertension: Secondary | ICD-10-CM | POA: Insufficient documentation

## 2023-05-07 DIAGNOSIS — Z7984 Long term (current) use of oral hypoglycemic drugs: Secondary | ICD-10-CM | POA: Diagnosis not present

## 2023-05-07 DIAGNOSIS — Z79899 Other long term (current) drug therapy: Secondary | ICD-10-CM | POA: Diagnosis not present

## 2023-05-07 DIAGNOSIS — R319 Hematuria, unspecified: Secondary | ICD-10-CM | POA: Diagnosis present

## 2023-05-07 DIAGNOSIS — Z7982 Long term (current) use of aspirin: Secondary | ICD-10-CM | POA: Insufficient documentation

## 2023-05-07 LAB — URINALYSIS, ROUTINE W REFLEX MICROSCOPIC
Bilirubin Urine: NEGATIVE
Glucose, UA: >=500 mg/dL — AB
Ketones, ur: NEGATIVE mg/dL
Nitrite: NEGATIVE
Protein, ur: NEGATIVE mg/dL
Specific Gravity, Urine: 1.015 (ref 1.005–1.030)
pH: 5.5 (ref 5.0–8.0)

## 2023-05-07 LAB — CBC
HCT: 37.9 % — ABNORMAL LOW (ref 39.0–52.0)
Hemoglobin: 12.8 g/dL — ABNORMAL LOW (ref 13.0–17.0)
MCH: 26.8 pg (ref 26.0–34.0)
MCHC: 33.8 g/dL (ref 30.0–36.0)
MCV: 79.3 fL — ABNORMAL LOW (ref 80.0–100.0)
Platelets: 252 10*3/uL (ref 150–400)
RBC: 4.78 MIL/uL (ref 4.22–5.81)
RDW: 13.7 % (ref 11.5–15.5)
WBC: 4.6 10*3/uL (ref 4.0–10.5)
nRBC: 0 % (ref 0.0–0.2)

## 2023-05-07 LAB — BASIC METABOLIC PANEL
Anion gap: 9 (ref 5–15)
BUN: 12 mg/dL (ref 8–23)
CO2: 22 mmol/L (ref 22–32)
Calcium: 9.2 mg/dL (ref 8.9–10.3)
Chloride: 104 mmol/L (ref 98–111)
Creatinine, Ser: 1.31 mg/dL — ABNORMAL HIGH (ref 0.61–1.24)
GFR, Estimated: 57 mL/min — ABNORMAL LOW (ref >60)
Glucose, Bld: 217 mg/dL — ABNORMAL HIGH (ref 70–99)
Potassium: 3.7 mmol/L (ref 3.5–5.1)
Sodium: 135 mmol/L (ref 135–145)

## 2023-05-07 LAB — URINALYSIS, MICROSCOPIC (REFLEX)

## 2023-05-07 MED ORDER — CLOTRIMAZOLE 1 % EX CREA: TOPICAL_CREAM | CUTANEOUS | 0 refills | Status: DC

## 2023-05-07 MED ORDER — CEPHALEXIN 500 MG PO CAPS: 500.0000 mg | ORAL_CAPSULE | Freq: Four times a day (QID) | ORAL | 0 refills | Status: DC

## 2023-05-07 NOTE — Discharge Instructions (Signed)
You were seen in the emergency today for bleeding in your groin.  Your urine sample was positive for an infection.  I think that your bleeding is likely coming from a fungal rash which she was scratched, causing it to bleed.  I am giving you antibiotics as well as an antifungal cream to use on the affected areas.  Please have these areas evaluated by your urologist when you see them next.  Please continue to monitor how you are doing and return to the ER for any new or worsening symptoms such as fever, or continued bleeding.

## 2023-05-07 NOTE — ED Triage Notes (Signed)
Pt reports hematuria starting today. Denies any pain. Pt on plavix and ASA. Endorses pain with urination, normal frequency.

## 2023-05-07 NOTE — ED Notes (Signed)
Pt endorsed hematuria, no physical assessment.

## 2023-05-07 NOTE — ED Provider Notes (Signed)
[place] [facility] Provider Note   CSN: [id number] Arrival date & time: 05/07/23  1720     History  Chief Complaint  Patient presents with   Hematuria    Christian Mclaughlin is a 77 y.o. male with history of diabetes, GERD, hypertension, prostate disease, sleep apnea, stroke, who presents to the emergency department complaining of bleeding from his testicles.  Patient states that this started earlier today.  He does see a urologist and is post to have a biopsy of his prostate next week.  He has intermittently been on some antibiotics for recurrent urinary tract infections.  Denies having any dysuria today.  He is on Plavix and aspirin.  He notes having a rash in his groin that has been very itchy, this started happening after he was wearing depends.  He was scratching it and then started noticing some bleeding, this is never happened before.   Hematuria       Home Medications Prior to Admission medications   Medication Sig Start Date End Date Taking? Authorizing Provider  cephALEXin (KEFLEX) 500 MG capsule Take 1 capsule (500 mg total) by mouth 4 (four) times daily. 05/07/23  Yes Danuel Felicetti T, PA-C  clotrimazole (LOTRIMIN) 1 % cream Apply to affected area 2 times daily 05/07/23  Yes Zhanna Melin T, PA-C  acetaminophen (TYLENOL) 500 MG tablet Take 1,000 mg by mouth every 6 (six) hours as needed for mild pain or headache.    [provider]  amLODipine (NORVASC) 10 MG tablet Take 10 mg by mouth daily. 04/25/18   [provider]  atorvastatin (LIPITOR) 40 MG tablet Take 1 tablet (40 mg total) by mouth daily at 6 PM. 06/02/18   Standley Brooking, MD  clopidogrel (PLAVIX) 75 MG tablet Take 1 tablet (75 mg total) by mouth daily. 06/03/18   Standley Brooking, MD  finasteride (PROSCAR) 5 MG tablet Take 5 mg by mouth daily.    [provider]  insulin glargine (LANTUS) 100 UNIT/ML injection Inject 32 Units into the skin.      [provider]  lisinopril (PRINIVIL,ZESTRIL) 10 MG tablet Take 10 mg by mouth daily.     [provider]  metFORMIN (GLUCOPHAGE-XR) 500 MG 24 hr tablet Take 500 mg by mouth daily with breakfast.    [provider]  pantoprazole (PROTONIX) 20 MG tablet Take 20 mg by mouth daily.    [provider]  terazosin (HYTRIN) 10 MG capsule  07/05/18   [provider]      Allergies    Patient has no known allergies.    Review of Systems   Review of Systems  Genitourinary:  Positive for scrotal swelling. Negative for dysuria and hematuria.       Scrotal bleeding  Skin:  Positive for rash.  All other systems reviewed and are negative.   Physical Exam Updated Vital Signs BP (!) 151/121   Pulse 62   Temp 97.9 F (36.6 C)   Resp 17   Ht 5\' 9"  (1.753 m)   Wt 90.7 kg   SpO2 99%   BMI 29.53 kg/m  Physical Exam Vitals and nursing note reviewed.  Constitutional:      Appearance: Normal appearance.  HENT:     Head: Normocephalic and atraumatic.  Eyes:     Conjunctiva/sclera: Conjunctivae normal.  Pulmonary:     Effort: Pulmonary effort is normal. No respiratory distress.  Genitourinary:      Comments: Area  of dry scaly rash to the right upper thigh just lateral to the scrotum, with evidence of excoriation and dried blood.  Small area of oozing blood on the surface of the scrotum.  Mild erythema with some generalized swelling of the scrotum.  No significant tenderness.  No abscess. Skin:    General: Skin is warm and dry.  Neurological:     Mental Status: He is alert.  Psychiatric:        Mood and Affect: Mood normal.        Behavior: Behavior normal.     ED Results / Procedures / Treatments   Labs (all labs ordered are listed, but only abnormal results are displayed) Labs Reviewed  URINALYSIS, ROUTINE W REFLEX MICROSCOPIC - Abnormal; Notable for the following components:      Result Value   APPearance CLOUDY (*)    Glucose, UA  >=500 (*)    Hgb urine dipstick SMALL (*)    Leukocytes,Ua LARGE (*)    All other components within normal limits  CBC - Abnormal; Notable for the following components:   Hemoglobin 12.8 (*)    HCT 37.9 (*)    MCV 79.3 (*)    All other components within normal limits  BASIC METABOLIC PANEL - Abnormal; Notable for the following components:   Glucose, Bld 217 (*)    Creatinine, Ser 1.31 (*)    GFR, Estimated 57 (*)    All other components within normal limits  URINALYSIS, MICROSCOPIC (REFLEX) - Abnormal; Notable for the following components:   Bacteria, UA MANY (*)    All other components within normal limits    EKG None  Radiology US SCROTUM  Result Date: 05/07/2023 CLINICAL DATA:  Right testicular swelling EXAM: ULTRASOUND OF SCROTUM TECHNIQUE: Complete ultrasound examination of the testicles, epididymis, and other scrotal structures was performed. COMPARISON:  None Available. FINDINGS: Right testicle Measurements: 3.8 x 2.0 x 2.6 cm. No mass or microlithiasis visualized. Left testicle Measurements: 3.7 x 1.8 x 2.5 cm. No mass or microlithiasis visualized. Right epididymis:  2 mm epididymal head cysts. Left epididymis:  Normal in size and appearance. Hydrocele:  Bilateral hydroceles, right greater than left. Varicocele:  Bilateral varicoceles. IMPRESSION: Bilateral hydroceles, right greater than left. Bilateral varicoceles. Electronically Signed   By: Charline Bills M.D.   On: 05/07/2023 20:47    Procedures Procedures    Medications Ordered in ED Medications - No data to display  ED Course/ Medical Decision Making/ A&P                                 Medical Decision Making Amount and/or Complexity of Data Reviewed Labs: ordered. Radiology: ordered.  Risk Prescription drug management.  This patient is a 77 y.o. male  who presents to the ED for concern of scrotal bleeding.   Differential diagnoses prior to evaluation: The emergent differential diagnosis includes,  but is not limited to,  cellulitis, injury, epididymitis or abscess. This is not an exhaustive differential.   Past Medical History / Co-morbidities / Social History: diabetes, GERD, hypertension, prostate disease, sleep apnea, stroke  Physical Exam: Physical exam performed. The pertinent findings include: Hypertensive, otherwise normal vital signs.  GU exam significant for some right scrotal swelling with a small area of oozing of blood.  Also dry scaly rash to the right upper thigh with evidence of excoriation and dried blood.  Lab Tests/Imaging studies: I personally interpreted labs/imaging and the  pertinent results include: UA significant for over 500 glucose, small hemoglobin, large leukocytes, many bacteria.  CBC and BMP grossly unremarkable, stable kidney function.  Girdle ultrasound was ordered and showed bilateral hydroceles and varicoceles. I agree with the radiologist interpretation.  Disposition: After consideration of the diagnostic results and the patients response to treatment, I feel that emergency department workup does not suggest an emergent condition requiring admission or immediate intervention beyond what has been performed at this time. The plan is: Discharge to home with treatment of UTI and likely tinea curious.  Believe that patient had a rash that was excoriated and started bleeding, worsened with being on aspirin and Plavix.  Will give antibiotics and antifungal cream.  Recommended follow-up with his urologist next week, which he is scheduled for his prostate biopsy. The patient is safe for discharge and has been instructed to return immediately for worsening symptoms, change in symptoms or any other concerns.  Final Clinical Impression(s) / ED Diagnoses Final diagnoses:  Acute cystitis with hematuria  Tinea cruris    Rx / DC Orders ED Discharge Orders          Ordered    clotrimazole (LOTRIMIN) 1 % cream        05/07/23 2106    cephALEXin (KEFLEX) 500 MG capsule   4 times daily        05/07/23 2106           Portions of this report may have been transcribed using voice recognition software. Every effort was made to ensure accuracy; however, inadvertent computerized transcription errors may be present.    Jeanella Flattery 05/07/23 2349    Alvira Monday, MD 05/08/23 1404

## 2023-06-09 NOTE — Progress Notes (Signed)
GU Location of Tumor / Histology: Prostate Ca  If Prostate Cancer, Gleason Score is (3 + 4) and PSA is (2.54 on 12/25/2022)  Biopsies      Past/Anticipated interventions by urology, if any: NA  Past/Anticipated interventions by medical oncology, if any: NA  Weight changes, if any: {:18581}  IPSS: SHIM:  Bowel/Bladder complaints, if any: {:18581}   Nausea/Vomiting, if any: {:18581}  Pain issues, if any:  {:18581}  SAFETY ISSUES: Prior radiation? {:18581} Pacemaker/ICD? {:18581} Possible current pregnancy? Male Is the patient on methotrexate? No  Current Complaints / other details:

## 2023-06-10 ENCOUNTER — Ambulatory Visit
Admission: RE | Admit: 2023-06-10 | Discharge: 2023-06-10 | Discharge status: Home / Self Care | Payer: TRICARE For Life (TFL) | Source: Ambulatory Visit | Attending: Radiation Oncology | Admitting: Radiation Oncology

## 2023-06-10 ENCOUNTER — Encounter: Payer: Self-pay | Admitting: Radiation Oncology

## 2023-06-10 ENCOUNTER — Ambulatory Visit
Admission: RE | Admit: 2023-06-10 | Discharge: 2023-06-10 | Disposition: A | Discharge status: Home / Self Care | Payer: Medicare Other | Source: Ambulatory Visit | Attending: Radiation Oncology | Admitting: Radiation Oncology

## 2023-06-10 VITALS — BP 149/92 | HR 71 | Temp 98.4°F | Resp 18 | Ht 69.0 in | Wt 195.8 lb

## 2023-06-10 DIAGNOSIS — C61 Malignant neoplasm of prostate: Secondary | ICD-10-CM

## 2023-06-10 NOTE — Progress Notes (Signed)
Introduced myself to the patient as the prostate nurse navigator.  No barriers to care identified at this time.  He is here to discuss his radiation treatment options, will proceed with 5.5 weeks of daily radiation.  I gave him my business card and asked him to call me with questions or concerns.  Verbalized understanding.

## 2023-06-10 NOTE — Progress Notes (Signed)
Radiation Oncology         [phone] ________________________________  Initial Outpatient Consultation  Name: Christian Mclaughlin MRN: [id number]  Date: 06/10/2023  DOB: December 21, 1945  GE:[id number], Gloriajean Dell, MD  Jannifer Hick, MD   REFERRING PHYSICIAN: Jannifer Hick, MD  DIAGNOSIS: 77 y.o. gentleman with Stage T1c adenocarcinoma of the prostate with Gleason score of 3+4, and PSA of 5 (adjusted for finasteride).    ICD-10-CM   1. Malignant neoplasm of prostate (HCC)  C61       HISTORY OF PRESENT ILLNESS: Christian Mclaughlin is a 77 y.o. male with a diagnosis of prostate cancer. He has been followed by [facility], previously Dr. Ronne Binning and now Dr. Cardell Peach, since at least 2019 for BPH with LUTS. He has been taking Terazosin and finasteride, which he notes some benefit from though he does still experience significant LUTS. Of note, he has previously declined invasive procedures for treatment.  He has had some improvement with Myrbetriq but he has not been able to continue taking it because it has been cost prohibitive.  More recently, his PSA increased from 1.9 (3.8 corrected for finasteride) in 12/2020 to 2.54 (5.08 corrected) in 12/2022. He was treated for UTI at that time. The patient later proceeded to transrectal ultrasound with 12 biopsies of the prostate on 05/13/23.  The prostate volume measured 113 cc.  Out of 12 core biopsies, 3 were positive.  The maximum Gleason score was 3+4, and this was seen in the left mid lateral and left mid (small focus). Additionally, a small focus of Gleason 3+3 was seen in the right apex.  The patient reviewed the biopsy results with his urologist and he has kindly been referred today for discussion of potential radiation treatment options.   PREVIOUS RADIATION THERAPY: No  PAST MEDICAL HISTORY:  Past Medical History:  Diagnosis Date   Diabetes mellitus without complication (HCC)    Elevated PSA    GERD (gastroesophageal reflux disease)    Hypertension     Prostate disease    Sleep apnea    Stroke (HCC)       PAST SURGICAL HISTORY: Past Surgical History:  Procedure Laterality Date   PROSTATE BIOPSY      FAMILY HISTORY: History reviewed. No pertinent family history.  SOCIAL HISTORY:  Social History   Socioeconomic History   Marital status: Married    Spouse name: Not on file   Number of children: Not on file   Years of education: Not on file   Highest education level: Not on file  Occupational History   Not on file  Tobacco Use   Smoking status: Never   Smokeless tobacco: Never  Vaping Use   Vaping status: Never Used  Substance and Sexual Activity   Alcohol use: Yes    Comment: occ   Drug use: Not Currently   Sexual activity: Not on file  Other Topics Concern   Not on file  Social History Narrative   Not on file   Social Determinants of Health   Financial Resource Strain: Not on file  Food Insecurity: No Food Insecurity (06/10/2023)   Hunger Vital Sign    Worried About Running Out of Food in the Last Year: Never true    Ran Out of Food in the Last Year: Never true  Transportation Needs: No Transportation Needs (06/10/2023)   PRAPARE - Administrator, Civil Service (Medical): No    Lack of Transportation (Non-Medical): No  Physical Activity:  Not on file  Stress: Not on file  Social Connections: Not on file  Intimate Partner Violence: Not At Risk (06/10/2023)   Humiliation, Afraid, Rape, and Kick questionnaire    Fear of Current or Ex-Partner: No    Emotionally Abused: No    Physically Abused: No    Sexually Abused: No    ALLERGIES: Patient has no known allergies.  MEDICATIONS:  Current Outpatient Medications  Medication Sig Dispense Refill   amLODipine (NORVASC) 10 MG tablet Take 10 mg by mouth daily.     ARTIFICIAL SALIVA MT Take by mouth.     aspirin EC 81 MG tablet Take 81 mg by mouth daily.     atorvastatin (LIPITOR) 40 MG tablet Take 1 tablet (40 mg total) by mouth daily at 6 PM. 30 tablet  0   Calcium Polycarbophil (FIBER) 625 MG TABS Take by mouth.     clopidogrel (PLAVIX) 75 MG tablet Take 1 tablet (75 mg total) by mouth daily. 30 tablet 0   ferrous sulfate 325 (65 FE) MG EC tablet Take 325 mg by mouth daily.     finasteride (PROSCAR) 5 MG tablet Take 5 mg by mouth daily.     insulin glargine (LANTUS) 100 UNIT/ML injection Inject 32 Units into the skin.      lisinopril (ZESTRIL) 40 MG tablet Take 40 mg by mouth daily.     metoprolol succinate (TOPROL-XL) 50 MG 24 hr tablet Take 50 mg by mouth daily. Takes half tablet  daily     Semaglutide, 1 MG/DOSE, 4 MG/3ML SOPN Inject into the skin.     terazosin (HYTRIN) 5 MG capsule Take 5 mg by mouth at bedtime.     No current facility-administered medications for this encounter.    REVIEW OF SYSTEMS:  On review of systems, the patient reports that he is doing well overall. He denies any chest pain, shortness of breath, cough, fevers, chills, night sweats, unintended weight changes. He denies any bowel disturbances, and denies abdominal pain, nausea or vomiting. He denies any new musculoskeletal or joint aches or pains. His IPSS was 22, indicating severe urinary symptoms. His SHIM was 5, indicating he has severe erectile dysfunction. A complete review of systems is obtained and is otherwise negative.    PHYSICAL EXAM:  Wt Readings from Last 3 Encounters:  06/10/23 195 lb 12.8 oz (88.8 kg)  05/07/23 200 lb (90.7 kg)  12/08/19 203 lb (92.1 kg)   Temp Readings from Last 3 Encounters:  06/10/23 98.4 F (36.9 C) (Oral)  05/07/23 97.9 F (36.6 C)  12/08/19 98.1 F (36.7 C) (Oral)   BP Readings from Last 3 Encounters:  06/10/23 (!) 149/92  05/07/23 (!) 151/121  12/08/19 (!) 151/91   Pulse Readings from Last 3 Encounters:  06/10/23 71  05/07/23 62  12/08/19 65   Pain Assessment Pain Score: 0-No pain/10  In general this is a well appearing African-American male in no acute distress. He's alert and oriented x4 and appropriate  throughout the examination. Cardiopulmonary assessment is negative for acute distress, and he exhibits normal effort.     KPS = 100  100 - Normal; no complaints; no evidence of disease. 90   - Able to carry on normal activity; minor signs or symptoms of disease. 80   - Normal activity with effort; some signs or symptoms of disease. 48   - Cares for self; unable to carry on normal activity or to do active work. 60   - Requires occasional assistance,  but is able to care for most of his personal needs. 50   - Requires considerable assistance and frequent medical care. 40   - Disabled; requires special care and assistance. 30   - Severely disabled; hospital admission is indicated although death not imminent. 20   - Very sick; hospital admission necessary; active supportive treatment necessary. 10   - Moribund; fatal processes progressing rapidly. 0     - Dead  Karnofsky DA, Abelmann WH, Craver LS and Burchenal [facility] [phone]) The use of the nitrogen mustards in the palliative treatment of carcinoma: with particular reference to bronchogenic carcinoma Cancer 1 634-56  LABORATORY DATA:  Lab Results  Component Value Date   WBC 4.6 05/07/2023   HGB 12.8 (L) 05/07/2023   HCT 37.9 (L) 05/07/2023   MCV 79.3 (L) 05/07/2023   PLT 252 05/07/2023   Lab Results  Component Value Date   NA 135 05/07/2023   K 3.7 05/07/2023   CL 104 05/07/2023   CO2 22 05/07/2023   Lab Results  Component Value Date   ALT 13 05/31/2018   AST 14 (L) 05/31/2018   ALKPHOS 57 05/31/2018   BILITOT 0.8 05/31/2018     RADIOGRAPHY: No results found.    IMPRESSION/PLAN: 1. 77 y.o. gentleman with Stage T1c adenocarcinoma of the prostate with Gleason Score of 3+4, and PSA of 5 (adjusted for finasteride). We discussed the patient's workup and outlined the nature of prostate cancer in this setting. The patient's T stage, Gleason's score, and PSA put him into the favorable intermediate risk group. Accordingly, he is eligible  for a variety of potential treatment options including brachytherapy, 5.5 weeks of external radiation, or prostatectomy. We discussed the available radiation techniques, and focused on the details and logistics of delivery. The patient is not a candidate for brachytherapy with a prostate volume of 113 cc and significant LUTS despite medical therapy.  Therefore, we discussed and outlined the risks, benefits, short and long-term effects associated with daily external beam radiotherapy and compared and contrasted these with prostatectomy. We discussed the role of SpaceOAR gel in reducing the rectal toxicity associated with radiotherapy.  He appears to have a good understanding of his disease and our treatment recommendations which are of curative intent.  He was encouraged to ask questions that were answered to his stated satisfaction.  At the conclusion of our conversation, the patient is interested in moving forward with 5.5 weeks of daily external beam therapy and is interested in further discussing having a TURP at the time of his procedure for fiducial markers and SpaceOAR gel placement prior to starting treatment. We will share our discussion with Dr. Cardell Peach and make arrangements for a follow-up visit, first available, to discuss the procedure in more detail.  If he elects to proceed with TURP, we would want to delay the start of radiation treatments for approximately 6 weeks postoperatively.  The patient appears to have a good understanding of his disease and our treatment recommendations which are of curative intent and is in agreement with the stated plan.  Therefore, we will remain in close communication with Dr. Cardell Peach regarding scheduling the procedure so that we can coordinate his CT simulation accordingly thereafter, in anticipation of beginning IMRT in the near future.  We enjoyed meeting him today and look forward to continuing to participate in his care.  We personally spent 70 minutes in this encounter  including chart review, reviewing radiological studies, meeting face-to-face with the patient, entering orders and completing documentation.  Marguarite Arbour, PA-C    Margaretmary Dys, MD  [facility]  Radiation Oncology Direct Dial: [phone]  Fax: [phone] [email or web address]  Skype  LinkedIn   This document serves as a record of services personally performed by Margaretmary Dys, MD and Marcello Fennel, PA-C. It was created on their behalf by Mickie Bail, a trained medical scribe. The creation of this record is based on the scribe's personal observations and the provider's statements to them. This document has been checked and approved by the attending provider.

## 2023-06-13 ENCOUNTER — Telehealth: Payer: Self-pay | Admitting: *Deleted

## 2023-06-13 NOTE — Telephone Encounter (Signed)
XXXX

## 2023-06-13 NOTE — Telephone Encounter (Signed)
CALLED PATIENT TO INFORM OF FU APPT. WITH DR. Cardell Peach FOR DISCUSSION OF HAVING A TURP WITH FID. MARKERS AND SPACE OAR PLACEMENT ON 07-23-23- ARRIVAL TIME- 8:15 AM, LVM FOR A RETURN CALL

## 2023-06-16 NOTE — Progress Notes (Signed)
Triage nurse at [facility] will review to see if patient can be scheduled sooner with Dr. Cardell Peach to review TURP.

## 2023-06-17 NOTE — Progress Notes (Signed)
Patient is now scheduled to meet with Dr. Cardell Peach on 10/16 to discuss TURP prior to radiation.

## 2023-07-10 NOTE — Progress Notes (Signed)
Patient saw Dr. Cardell Peach on 10/17 and has elected to proceed with TURP prior to radiation.    RN will continue to follow to ensure radiation gets coordinated approximately 6 weeks post TURP.

## 2023-07-30 ENCOUNTER — Other Ambulatory Visit: Payer: Self-pay | Admitting: Urology

## 2023-07-31 NOTE — Progress Notes (Signed)
Patient is scheduled for TURP along with fiducial's and spaceOAR placement on 12/2.    Request placed for CT Simulation to be scheduled early Jan to allow healing time prior to radiation.

## 2023-08-07 NOTE — Progress Notes (Signed)
RN spoke with patient to follow up with information regarding next steps after TURP procedure on 12/2.  All questions answered, no additional needs at this time.

## 2023-08-11 NOTE — Progress Notes (Signed)
Left VM with Zona surgery scheduler at [facility] that pt would need instructions on holding ASA and Plavix prior to procedure.

## 2023-08-14 ENCOUNTER — Encounter (HOSPITAL_BASED_OUTPATIENT_CLINIC_OR_DEPARTMENT_OTHER): Payer: Self-pay | Admitting: Urology

## 2023-08-14 NOTE — Progress Notes (Addendum)
Addendum:   Received via fax from [facility] Peach office pt's PCP clearance , placed on chart.  Spoke w/ via phone for pre-op interview--- pt Lab needs dos----  [employer], ekg       Lab results------ no COVID test -----patient states asymptomatic no test needed Arrive at ------- 1000 on 08-25-2023 NPO after MN NO Solid Food.  Clear liquids from MN until--- 0900 Med rec completed Medications to take morning of surgery ----- toprol, norvasc Diabetic medication ----- do half dose glarine-yfgn (lantus) night before surgery Pt stated was given instructions from office that last dose ozempic to be 08-13-2023 and do not do 08-20-2023 dose prior to surgery. (Pt does it on Wednesday's)  Patient instructed to bring photo id and insurance card day of surgery Patient aware to have Driver (ride ) / caregiver for 24 hours after surgery - wife,  Christian Mclaughlin Patient Special Instructions -----  will do one fleet enema night before surgery. Reviewed [facility] and visitor guidelines .  Asked pt to bring cpap/ mask/ tubing dos.  Pre-Op special Instructions -----  called left voice message w/ Christian Mclaughlin, OR scheduler for [facility], requested for clearance to faxed from pt's PCP for his ASA/ Plavix today Pt has libre 3 today on his left upper arm, stated he will be changing prior to surgery.  Patient verbalized understanding of instructions that were given at this phone interview. Patient denies chest pain, sob, fever, cough at the interview.   Anesthesia Review:  HTN;  hx CVA without residual 09/ 2019 (none since);  DM 2 w/ insulin; CKD 3;  OSA w/ cpap (pt stated uses 5 days per week)  PCP:  Christian Mclaughlin [facility] 06-06-2023 care everywhere) EKG :  05-31-2018 Echo : 06-01-2018 Stress test: no Cardiac Cath : no Activity level:  pt denies sob w/ any acitivity Sleep Study/ CPAP :  yes/  yes  Fasting Blood Sugar :  100-140    / Checks Blood Sugar -- times a day:  multiple w/ Libre 3 Blood Thinner/ Instructions /Last Dose:   Plavix ASA / Instructions/ Last Dose :  ASA 81mg  Pt stated was given instructions by [facility] Cardell Peach office to stop ASA/ Plavix prior to surgery, last dose to be 08/18/2023.

## 2023-08-15 NOTE — Progress Notes (Signed)
Patient called in and left a voicemail on [employer]. Since Angelique Blonder is out of the office today, I returned the patient's call. He was concerned that his blood sugar would drop too low on the day of surgery if he didn't eat after MN. I told him that he could not eat after MN, but he could have soda, sweet tea, apple juice (no pulp) up until 9 am the morning of surgery.

## 2023-08-25 ENCOUNTER — Encounter (HOSPITAL_BASED_OUTPATIENT_CLINIC_OR_DEPARTMENT_OTHER): Payer: Self-pay | Admitting: Urology

## 2023-08-25 ENCOUNTER — Ambulatory Visit (HOSPITAL_BASED_OUTPATIENT_CLINIC_OR_DEPARTMENT_OTHER): Payer: Medicare Other | Admitting: Certified Registered Nurse Anesthetist

## 2023-08-25 ENCOUNTER — Observation Stay (HOSPITAL_BASED_OUTPATIENT_CLINIC_OR_DEPARTMENT_OTHER)
Admission: RE | Admit: 2023-08-25 | Discharge: 2023-08-26 | Disposition: A | Discharge status: Home / Self Care | Payer: Medicare Other | Source: Ambulatory Visit | Attending: Urology | Admitting: Urology

## 2023-08-25 ENCOUNTER — Other Ambulatory Visit: Payer: Self-pay

## 2023-08-25 ENCOUNTER — Encounter (HOSPITAL_BASED_OUTPATIENT_CLINIC_OR_DEPARTMENT_OTHER)
Admission: RE | Disposition: A | Discharge status: Home / Self Care | Payer: Self-pay | Source: Ambulatory Visit | Attending: Urology

## 2023-08-25 DIAGNOSIS — N401 Enlarged prostate with lower urinary tract symptoms: Secondary | ICD-10-CM | POA: Diagnosis not present

## 2023-08-25 DIAGNOSIS — I129 Hypertensive chronic kidney disease with stage 1 through stage 4 chronic kidney disease, or unspecified chronic kidney disease: Secondary | ICD-10-CM | POA: Diagnosis not present

## 2023-08-25 DIAGNOSIS — C61 Malignant neoplasm of prostate: Principal | ICD-10-CM | POA: Insufficient documentation

## 2023-08-25 DIAGNOSIS — N183 Chronic kidney disease, stage 3 unspecified: Secondary | ICD-10-CM | POA: Diagnosis not present

## 2023-08-25 DIAGNOSIS — Z01818 Encounter for other preprocedural examination: Principal | ICD-10-CM

## 2023-08-25 DIAGNOSIS — Z7982 Long term (current) use of aspirin: Secondary | ICD-10-CM | POA: Diagnosis not present

## 2023-08-25 DIAGNOSIS — Z79899 Other long term (current) drug therapy: Secondary | ICD-10-CM | POA: Insufficient documentation

## 2023-08-25 DIAGNOSIS — N320 Bladder-neck obstruction: Secondary | ICD-10-CM | POA: Diagnosis not present

## 2023-08-25 DIAGNOSIS — E1122 Type 2 diabetes mellitus with diabetic chronic kidney disease: Secondary | ICD-10-CM | POA: Diagnosis not present

## 2023-08-25 DIAGNOSIS — Z87891 Personal history of nicotine dependence: Secondary | ICD-10-CM | POA: Insufficient documentation

## 2023-08-25 DIAGNOSIS — N400 Enlarged prostate without lower urinary tract symptoms: Secondary | ICD-10-CM | POA: Diagnosis present

## 2023-08-25 HISTORY — DX: Other obstructive and reflux uropathy: N40.1

## 2023-08-25 HISTORY — PX: SPACE OAR INSTILLATION: SHX6769

## 2023-08-25 HISTORY — DX: Benign prostatic hyperplasia with lower urinary tract symptoms: N40.1

## 2023-08-25 HISTORY — DX: Long term (current) use of insulin: Z79.4

## 2023-08-25 HISTORY — DX: Hyperlipidemia, unspecified: E78.5

## 2023-08-25 HISTORY — DX: Presence of spectacles and contact lenses: Z97.3

## 2023-08-25 HISTORY — DX: Other cervical disc degeneration, unspecified cervical region: M50.30

## 2023-08-25 HISTORY — DX: Obstructive sleep apnea (adult) (pediatric): G47.33

## 2023-08-25 HISTORY — DX: Male erectile dysfunction, unspecified: N52.9

## 2023-08-25 HISTORY — DX: Benign prostatic hyperplasia with lower urinary tract symptoms: N13.8

## 2023-08-25 HISTORY — PX: GOLD SEED IMPLANT: SHX6343

## 2023-08-25 HISTORY — DX: Type 2 diabetes mellitus without complications: E11.9

## 2023-08-25 HISTORY — DX: Urge incontinence: N39.41

## 2023-08-25 HISTORY — PX: TRANSURETHRAL RESECTION OF PROSTATE: SHX73

## 2023-08-25 HISTORY — DX: Presence of dental prosthetic device (complete) (partial): Z97.2

## 2023-08-25 HISTORY — DX: Chronic kidney disease, stage 3 unspecified: N18.30

## 2023-08-25 LAB — GLUCOSE, CAPILLARY
Glucose-Capillary: 141 mg/dL — ABNORMAL HIGH (ref 70–99)
Glucose-Capillary: 237 mg/dL — ABNORMAL HIGH (ref 70–99)
Glucose-Capillary: 181 mg/dL — ABNORMAL HIGH (ref 70–99)

## 2023-08-25 LAB — POCT I-STAT, CHEM 8
BUN: 19 mg/dL (ref 8–23)
Calcium, Ion: 1.23 mmol/L (ref 1.15–1.40)
Chloride: 105 mmol/L (ref 98–111)
Creatinine, Ser: 1.3 mg/dL — ABNORMAL HIGH (ref 0.61–1.24)
Glucose, Bld: 177 mg/dL — ABNORMAL HIGH (ref 70–99)
HCT: 45 % (ref 39.0–52.0)
Hemoglobin: 15.3 g/dL (ref 13.0–17.0)
Potassium: 4.6 mmol/L (ref 3.5–5.1)
Sodium: 140 mmol/L (ref 135–145)
TCO2: 25 mmol/L (ref 22–32)

## 2023-08-25 SURGERY — TURP (TRANSURETHRAL RESECTION OF PROSTATE)
Anesthesia: General

## 2023-08-25 MED ORDER — INSULIN ASPART 100 UNIT/ML IJ SOLN
0.0000 [IU] | INTRAMUSCULAR | Status: DC
  Administered 2023-08-25: 5 [IU] via SUBCUTANEOUS
  Administered 2023-08-25 – 2023-08-26 (×3): 3 [IU] via SUBCUTANEOUS

## 2023-08-25 MED ORDER — POLYETHYLENE GLYCOL 3350 17 G PO PACK: 17.0000 g | PACK | Freq: Every day | ORAL | Status: DC | PRN

## 2023-08-25 MED ORDER — FENTANYL CITRATE (PF) 100 MCG/2ML IJ SOLN
INTRAMUSCULAR | Status: AC
  Filled 2023-08-25: qty 2

## 2023-08-25 MED ORDER — INSULIN ASPART 100 UNIT/ML IJ SOLN
INTRAMUSCULAR | Status: AC
  Filled 2023-08-25: qty 1

## 2023-08-25 MED ORDER — OXYBUTYNIN CHLORIDE ER 10 MG PO TB24
ORAL_TABLET | ORAL | Status: AC
  Filled 2023-08-25: qty 1

## 2023-08-25 MED ORDER — HYDROMORPHONE HCL 1 MG/ML IJ SOLN
0.5000 mg | INTRAMUSCULAR | Status: DC | PRN
Start: 2023-08-25 — End: 2023-08-26

## 2023-08-25 MED ORDER — SODIUM CHLORIDE (PF) 0.9 % IJ SOLN
INTRAMUSCULAR | Status: DC | PRN
  Administered 2023-08-25: 10 mL

## 2023-08-25 MED ORDER — OXYCODONE-ACETAMINOPHEN 5-325 MG PO TABS: 1.0000 | ORAL_TABLET | ORAL | 0 refills | Status: DC | PRN

## 2023-08-25 MED ORDER — ACETAMINOPHEN 325 MG PO TABS: 650.0000 mg | ORAL_TABLET | ORAL | Status: DC | PRN

## 2023-08-25 MED ORDER — ATORVASTATIN CALCIUM 40 MG PO TABS
40.0000 mg | ORAL_TABLET | Freq: Every day | ORAL | Status: DC
  Administered 2023-08-25: 40 mg via ORAL
  Filled 2023-08-25: qty 1

## 2023-08-25 MED ORDER — ONDANSETRON HCL 4 MG/2ML IJ SOLN
INTRAMUSCULAR | Status: DC | PRN
  Administered 2023-08-25: 4 mg via INTRAVENOUS

## 2023-08-25 MED ORDER — EPHEDRINE SULFATE-NACL 50-0.9 MG/10ML-% IV SOSY
PREFILLED_SYRINGE | INTRAVENOUS | Status: DC | PRN
  Administered 2023-08-25 (×5): 5 mg via INTRAVENOUS

## 2023-08-25 MED ORDER — CEFAZOLIN SODIUM-DEXTROSE 2-4 GM/100ML-% IV SOLN
2.0000 g | INTRAVENOUS | Status: AC
  Administered 2023-08-25: 2 g via INTRAVENOUS

## 2023-08-25 MED ORDER — CEFAZOLIN SODIUM-DEXTROSE 2-4 GM/100ML-% IV SOLN
INTRAVENOUS | Status: AC
  Filled 2023-08-25: qty 100

## 2023-08-25 MED ORDER — OXYCODONE-ACETAMINOPHEN 5-325 MG PO TABS
ORAL_TABLET | ORAL | Status: AC
  Filled 2023-08-25: qty 1

## 2023-08-25 MED ORDER — PROPOFOL 10 MG/ML IV BOLUS
INTRAVENOUS | Status: DC | PRN
  Administered 2023-08-25: 160 mg via INTRAVENOUS

## 2023-08-25 MED ORDER — DEXAMETHASONE SODIUM PHOSPHATE 10 MG/ML IJ SOLN
INTRAMUSCULAR | Status: DC | PRN
  Administered 2023-08-25: 5 mg via INTRAVENOUS

## 2023-08-25 MED ORDER — OXYCODONE-ACETAMINOPHEN 5-325 MG PO TABS
1.0000 | ORAL_TABLET | ORAL | Status: DC | PRN
  Administered 2023-08-25 – 2023-08-26 (×5): 1 via ORAL

## 2023-08-25 MED ORDER — BUPIVACAINE HCL (PF) 0.25 % IJ SOLN
INTRAMUSCULAR | Status: DC | PRN
  Administered 2023-08-25: 7 mL

## 2023-08-25 MED ORDER — ONDANSETRON HCL 4 MG/2ML IJ SOLN: 4.0000 mg | INTRAMUSCULAR | Status: DC | PRN

## 2023-08-25 MED ORDER — SODIUM CHLORIDE 0.9 % IR SOLN
Status: DC | PRN
  Administered 2023-08-25: 18000 mL via INTRAVESICAL

## 2023-08-25 MED ORDER — METOPROLOL SUCCINATE ER 25 MG PO TB24
25.0000 mg | ORAL_TABLET | Freq: Every day | ORAL | Status: DC
Start: 2023-08-25 — End: 2023-08-26
  Administered 2023-08-25: 25 mg via ORAL
  Filled 2023-08-25: qty 1

## 2023-08-25 MED ORDER — PROPOFOL 10 MG/ML IV BOLUS
INTRAVENOUS | Status: AC
  Filled 2023-08-25: qty 20

## 2023-08-25 MED ORDER — AMLODIPINE BESYLATE 10 MG PO TABS
10.0000 mg | ORAL_TABLET | Freq: Every day | ORAL | Status: DC
Start: 2023-08-25 — End: 2023-08-26
  Administered 2023-08-25: 10 mg via ORAL
  Filled 2023-08-25: qty 1

## 2023-08-25 MED ORDER — DEXAMETHASONE SODIUM PHOSPHATE 10 MG/ML IJ SOLN
INTRAMUSCULAR | Status: AC
  Filled 2023-08-25: qty 1

## 2023-08-25 MED ORDER — OXYBUTYNIN CHLORIDE ER 10 MG PO TB24
10.0000 mg | ORAL_TABLET | Freq: Every day | ORAL | Status: DC
  Administered 2023-08-25: 10 mg via ORAL

## 2023-08-25 MED ORDER — SODIUM CHLORIDE 0.9 % IR SOLN
3000.0000 mL | Status: DC
  Administered 2023-08-25: 3000 mL

## 2023-08-25 MED ORDER — OXYCODONE HCL 5 MG PO TABS
ORAL_TABLET | ORAL | Status: AC
  Filled 2023-08-25: qty 1

## 2023-08-25 MED ORDER — LISINOPRIL 20 MG PO TABS
40.0000 mg | ORAL_TABLET | Freq: Every day | ORAL | Status: DC
  Administered 2023-08-25: 40 mg via ORAL
  Filled 2023-08-25: qty 2

## 2023-08-25 MED ORDER — LIDOCAINE 2% (20 MG/ML) 5 ML SYRINGE
INTRAMUSCULAR | Status: DC | PRN
  Administered 2023-08-25: 100 mg via INTRAVENOUS

## 2023-08-25 MED ORDER — FENTANYL CITRATE (PF) 250 MCG/5ML IJ SOLN
INTRAMUSCULAR | Status: DC | PRN
  Administered 2023-08-25: 25 ug via INTRAVENOUS
  Administered 2023-08-25: 50 ug via INTRAVENOUS
  Administered 2023-08-25: 25 ug via INTRAVENOUS

## 2023-08-25 MED ORDER — ONDANSETRON HCL 4 MG/2ML IJ SOLN
INTRAMUSCULAR | Status: AC
  Filled 2023-08-25: qty 2

## 2023-08-25 MED ORDER — SODIUM CHLORIDE 0.9% FLUSH
3.0000 mL | Freq: Two times a day (BID) | INTRAVENOUS | Status: DC
  Administered 2023-08-25: 3 mL via INTRAVENOUS

## 2023-08-25 MED ORDER — SODIUM CHLORIDE 0.9% FLUSH: 3.0000 mL | INTRAVENOUS | Status: DC | PRN

## 2023-08-25 MED ORDER — DOCUSATE SODIUM 100 MG PO CAPS: 100.0000 mg | ORAL_CAPSULE | Freq: Every day | ORAL | 0 refills | Status: DC | PRN

## 2023-08-25 MED ORDER — FINASTERIDE 5 MG PO TABS
5.0000 mg | ORAL_TABLET | Freq: Every day | ORAL | Status: DC
Start: 2023-08-25 — End: 2023-08-26
  Administered 2023-08-25: 5 mg via ORAL
  Filled 2023-08-25: qty 1

## 2023-08-25 MED ORDER — MIDAZOLAM HCL 2 MG/2ML IJ SOLN
INTRAMUSCULAR | Status: DC | PRN
  Administered 2023-08-25: 2 mg via INTRAVENOUS

## 2023-08-25 MED ORDER — LIDOCAINE HCL (PF) 2 % IJ SOLN
INTRAMUSCULAR | Status: AC
  Filled 2023-08-25: qty 5

## 2023-08-25 MED ORDER — ZOLPIDEM TARTRATE 5 MG PO TABS: 5.0000 mg | ORAL_TABLET | Freq: Every evening | ORAL | Status: DC | PRN

## 2023-08-25 MED ORDER — MIDAZOLAM HCL 2 MG/2ML IJ SOLN
INTRAMUSCULAR | Status: AC
  Filled 2023-08-25: qty 2

## 2023-08-25 MED ORDER — DIPHENHYDRAMINE HCL 50 MG/ML IJ SOLN: 12.5000 mg | Freq: Four times a day (QID) | INTRAMUSCULAR | Status: DC | PRN

## 2023-08-25 MED ORDER — TERAZOSIN HCL 5 MG PO CAPS
5.0000 mg | ORAL_CAPSULE | Freq: Every day | ORAL | Status: DC
Start: 2023-08-25 — End: 2023-08-26
  Administered 2023-08-25: 5 mg via ORAL
  Filled 2023-08-25: qty 1

## 2023-08-25 MED ORDER — CYCLOBENZAPRINE HCL 10 MG PO TABS
10.0000 mg | ORAL_TABLET | Freq: Two times a day (BID) | ORAL | Status: DC | PRN
  Administered 2023-08-25: 10 mg via ORAL
  Filled 2023-08-25 (×3): qty 1

## 2023-08-25 MED ORDER — SODIUM CHLORIDE 0.9 % IV SOLN: 250.0000 mL | INTRAVENOUS | Status: DC | PRN

## 2023-08-25 MED ORDER — SODIUM CHLORIDE 0.9 % IV SOLN: INTRAVENOUS | Status: DC

## 2023-08-25 MED ORDER — DIPHENHYDRAMINE HCL 12.5 MG/5ML PO ELIX: 12.5000 mg | ORAL_SOLUTION | Freq: Four times a day (QID) | ORAL | Status: DC | PRN

## 2023-08-25 SURGICAL SUPPLY — 46 items
BAG DRAIN URO-CYSTO SKYTR STRL (DRAIN) ×2 IMPLANT
BAG URINE DRAIN 2000ML AR STRL (UROLOGICAL SUPPLIES) ×2 IMPLANT
BAG URINE LEG 500ML (DRAIN) IMPLANT
BLADE CLIPPER SENSICLIP SURGIC (BLADE) ×2 IMPLANT
CATH FOLEY 2WAY SLVR 30CC 20FR (CATHETERS) IMPLANT
CATH FOLEY 3WAY 30CC 22FR (CATHETERS) ×2 IMPLANT
CLOTH BEACON ORANGE TIMEOUT ST (SAFETY) ×2 IMPLANT
CNTNR URN SCR LID CUP LEK RST (MISCELLANEOUS) ×2 IMPLANT
COVER BACK TABLE 60X90IN (DRAPES) ×2 IMPLANT
DRSG TEGADERM 4X4.75 (GAUZE/BANDAGES/DRESSINGS) ×2 IMPLANT
DRSG TEGADERM 8X12 (GAUZE/BANDAGES/DRESSINGS) ×2 IMPLANT
ELECT BIVAP BIPO 22/24 DONUT (ELECTROSURGICAL) IMPLANT
ELECT REM PT RETURN 9FT ADLT (ELECTROSURGICAL) IMPLANT
ELECTRD BIVAP BIPO 22/24 DONUT (ELECTROSURGICAL) IMPLANT
ELECTRODE REM PT RTRN 9FT ADLT (ELECTROSURGICAL) IMPLANT
GAUZE SPONGE 4X4 12PLY STRL (GAUZE/BANDAGES/DRESSINGS) ×2 IMPLANT
GLOVE BIO SURGEON STRL SZ7 (GLOVE) ×2 IMPLANT
GLOVE BIO SURGEON STRL SZ7.5 (GLOVE) ×2 IMPLANT
GLOVE SURG ORTHO 8.5 STRL (GLOVE) ×2 IMPLANT
GOWN STRL REUS W/TWL LRG LVL3 (GOWN DISPOSABLE) ×2 IMPLANT
HOLDER FOLEY CATH W/STRAP (MISCELLANEOUS) ×2 IMPLANT
IMPL SPACEOAR VUE SYSTEM (Spacer) ×2 IMPLANT
IMPLANT SPACEOAR VUE SYSTEM (Spacer) ×1 IMPLANT
IV NS IRRIG 3000ML ARTHROMATIC (IV SOLUTION) ×4 IMPLANT
KIT TURNOVER CYSTO (KITS) ×2 IMPLANT
LOOP CUT BIPOLAR 24F LRG (ELECTROSURGICAL) ×2 IMPLANT
MANIFOLD NEPTUNE II (INSTRUMENTS) ×2 IMPLANT
MARKER GOLD PRELOAD 1.2X3 (Urological Implant) ×2 IMPLANT
MARKER SKIN DUAL TIP RULER LAB (MISCELLANEOUS) ×2 IMPLANT
NDL SPNL 22GX3.5 QUINCKE BK (NEEDLE) ×2 IMPLANT
NEEDLE SPNL 22GX3.5 QUINCKE BK (NEEDLE) ×1 IMPLANT
PACK CYSTO (CUSTOM PROCEDURE TRAY) ×2 IMPLANT
SEED GOLD PRELOAD 1.2X3 (Urological Implant) ×1 IMPLANT
SHEATH ULTRASOUND LF (SHEATH) IMPLANT
SHEATH ULTRASOUND LTX NONSTRL (SHEATH) IMPLANT
SLEEVE SCD COMPRESS KNEE MED (STOCKING) ×2 IMPLANT
SURGILUBE 2OZ TUBE FLIPTOP (MISCELLANEOUS) ×2 IMPLANT
SYR 10ML LL (SYRINGE) ×2 IMPLANT
SYR 30ML LL (SYRINGE) ×2 IMPLANT
SYR CONTROL 10ML LL (SYRINGE) ×2 IMPLANT
SYR TOOMEY IRRIG 70ML (MISCELLANEOUS) ×1 IMPLANT
SYRINGE TOOMEY IRRIG 70ML (MISCELLANEOUS) ×2 IMPLANT
TOWEL OR 17X24 6PK STRL BLUE (TOWEL DISPOSABLE) ×2 IMPLANT
TUBE CONNECTING 12X1/4 (SUCTIONS) ×2 IMPLANT
TUBING UROLOGY SET (TUBING) ×2 IMPLANT
UNDERPAD 30X36 HEAVY ABSORB (UNDERPADS AND DIAPERS) ×2 IMPLANT

## 2023-08-25 NOTE — Anesthesia Postprocedure Evaluation (Signed)
Anesthesia Post Note  Patient: Christian Mclaughlin  Procedure(s) Performed: BIPOLAR TRANSURETHRAL RESECTION OF THE PROSTATE (TURP) GOLD SEED IMPLANT SPACE OAR INSTILLATION     Patient location during evaluation: PACU Anesthesia Type: General Level of consciousness: awake and alert Pain management: pain level controlled Vital Signs Assessment: post-procedure vital signs reviewed and stable Respiratory status: spontaneous breathing, nonlabored ventilation, respiratory function stable and patient connected to nasal cannula oxygen Cardiovascular status: blood pressure returned to baseline and stable Postop Assessment: no apparent nausea or vomiting Anesthetic complications: no  No notable events documented.  Last Vitals:  Vitals:   08/25/23 1413 08/25/23 1415  BP: (!) 143/81 (!) 143/81  Pulse: 72 67  Resp: 15 16  Temp:    SpO2: 93% 95%    Last Pain:  Vitals:   08/25/23 1340  TempSrc:   PainSc: 0-No pain                 Drema Eddington S

## 2023-08-25 NOTE — Transfer of Care (Signed)
Immediate Anesthesia Transfer of Care Note  Patient: Christian Mclaughlin  Procedure(s) Performed: BIPOLAR TRANSURETHRAL RESECTION OF THE PROSTATE (TURP) GOLD SEED IMPLANT SPACE OAR INSTILLATION  Patient Location: PACU  Anesthesia Type:General  Level of Consciousness: sedated  Airway & Oxygen Therapy: Patient Spontanous Breathing and Patient connected to nasal cannula oxygen  Post-op Assessment: Report given to RN and Post -op Vital signs reviewed and stable  Post vital signs: Reviewed and stable  Last Vitals:  Vitals Value Taken Time  BP 128/77 08/25/23 1340  Temp 36.7 C 08/25/23 1340  Pulse 65 08/25/23 1342  Resp 11 08/25/23 1342  SpO2 93 % 08/25/23 1342  Vitals shown include unfiled device data.  Last Pain:  Vitals:   08/25/23 1035  TempSrc: Oral  PainSc: 0-No pain      Patients Stated Pain Goal: 3 (08/25/23 1035)  Complications: No notable events documented.

## 2023-08-25 NOTE — Anesthesia Preprocedure Evaluation (Signed)
Anesthesia Evaluation  Patient identified by MRN, date of birth, ID band Patient awake    Reviewed: Allergy & Precautions, H&P , NPO status , Patient's Chart, lab work & pertinent test results  Airway Mallampati: III  TM Distance: <3 FB Neck ROM: Full    Dental  (+) Partial Upper, Partial Lower   Pulmonary sleep apnea and Continuous Positive Airway Pressure Ventilation , former smoker   Pulmonary exam normal breath sounds clear to auscultation       Cardiovascular hypertension, Normal cardiovascular exam Rhythm:Regular Rate:Normal     Neuro/Psych CVA, No Residual Symptoms  negative psych ROS   GI/Hepatic negative GI ROS, Neg liver ROS,,,  Endo/Other  negative endocrine ROSdiabetes, Type 2    Renal/GU Renal InsufficiencyRenal disease  negative genitourinary   Musculoskeletal negative musculoskeletal ROS (+)    Abdominal   Peds negative pediatric ROS (+)  Hematology negative hematology ROS (+)   Anesthesia Other Findings   Reproductive/Obstetrics negative OB ROS                             Anesthesia Physical Anesthesia Plan  ASA: 3  Anesthesia Plan: General   Post-op Pain Management: Minimal or no pain anticipated   Induction: Intravenous  PONV Risk Score and Plan: 2 and Ondansetron, Dexamethasone and Treatment may vary due to age or medical condition  Airway Management Planned: LMA  Additional Equipment:   Intra-op Plan:   Post-operative Plan: Extubation in OR  Informed Consent: I have reviewed the patients History and Physical, chart, labs and discussed the procedure including the risks, benefits and alternatives for the proposed anesthesia with the patient or authorized representative who has indicated his/her understanding and acceptance.     Dental advisory given  Plan Discussed with: CRNA and Surgeon  Anesthesia Plan Comments:        Anesthesia Quick  Evaluation

## 2023-08-25 NOTE — Anesthesia Procedure Notes (Signed)
Procedure Name: LMA Insertion Date/Time: 08/25/2023 12:12 PM  Performed by: Dairl Ponder, CRNAPre-anesthesia Checklist: Patient identified, Emergency Drugs available, Suction available and Patient being monitored Patient Re-evaluated:Patient Re-evaluated prior to induction Oxygen Delivery Method: Circle System Utilized Preoxygenation: Pre-oxygenation with 100% oxygen Induction Type: IV induction Ventilation: Mask ventilation without difficulty LMA: LMA inserted LMA Size: 4.0 Number of attempts: 1 Airway Equipment and Method: Bite block Placement Confirmation: positive ETCO2 Tube secured with: Tape Dental Injury: Teeth and Oropharynx as per pre-operative assessment

## 2023-08-25 NOTE — Discharge Instructions (Signed)
Activity:  You are encouraged to ambulate frequently (about every hour during waking hours) to help prevent blood clots from forming in your legs or lungs.   ? ?Diet: You should advance your diet as instructed by your physician.  It will be normal to have some bloating, nausea, and abdominal discomfort intermittently. ? ?Prescriptions:  You will be provided a prescription for pain medication to take as needed.  If your pain is not severe enough to require the prescription pain medication, you may take extra strength Tylenol instead which will have less side effects.  You should also take a prescribed stool softener to avoid straining with bowel movements as the prescription pain medication may constipate you. ? ?What to call us about: You should call the office ([phone]) if you develop fever > 101 or develop persistent vomiting. Activity:  You are encouraged to ambulate frequently (about every hour during waking hours) to help prevent blood clots from forming in your legs or lungs.   ? ?You have a foley catheter in place. Return to clinic in 3 days for foley removal. ? ?

## 2023-08-25 NOTE — H&P (Signed)
Office Visit Report     08/19/2023   --------------------------------------------------------------------------------   Christian Mclaughlin  MRN: [id number]  DOB: 03/21/1946, 77 year old Male  SSN: [id number]   PRIMARY CARE:  Christian Mclaughlin. Christian Campanile, MD  PRIMARY CARE FAX:  [phone]  REFERRING:  Christian Hick, MD  PROVIDER:  Jettie Mclaughlin, M.D.  TREATING:  Christian Mclaughlin, [place]  LOCATION:  [facility] [phone]     --------------------------------------------------------------------------------   CC/HPI: Pt presents today for pre-operative history and physical exam in anticipation of bipolar TURP, fiducial markers, and space oar by Christian Mclaughlin on 08/25/23. He is doing well and is without complaint.   Pt denies F/C, HA, CP, SOB, N/V, diarrhea/constipation, back pain, flank pain, hematuria, and dysuria.     HX:    Christian Mclaughlin is a 77 year old male seen to discuss his new diagnosis of prostate cancer and definitive treatment options.   1. Favorable intermediate risk prostate cancer:  Patient underwent prostate biopsy on 05/13/2023 for an elevated PSA of 2.5 (when corrected for use of finasteride). Biopsy revealed GS 3+4 = 7 in 2 cores and GS 3+3 = 6 in 1 core adenocarcinoma of the prostate with 3/12 total cores positive (5-20%), TRUS volume of 113 cm3. Denies new or worsening bone or back pain. Good appetite and stable weight.   Family history: Denies  Imaging studies: None   PMH: Diabetes, GERD, hypertension, obstructive sleep apnea on CPAP, CVA in 2019 on Plavix and aspirin.  PSH: none   TNM stage: cT1c  PSA: 2.5 (when corrected for use of finasteride)  Gleason score: GS 3+4 = 7  Biopsy: 05/13/2023  Left: GS 3+4 = 7 in the left mid and left lateral mid  Right: GS 3+3 cosigns 6 in the right apex  Prostate volume: 13 cc  PSAD: 0.04   Nomogram  CSS (15-year): 98%  PFS (5 year, 10 year): 85%, 74%  EPE: 30%  LNI: 4%  SVI: 3%   He has met with Christian Mclaughlin and has  elected proceed with EBRT.   2. BPH/LUTS/urinary urgency:  He previously followed with Christian Mclaughlin and has been on several medications over the past several years to aid his urgency and BPH with lower urinary tract symptoms. Briefly, in 2019 he had daily urge incontinence on Terazosin and finasteride. He was placed on mirabegron 25 mg with continued urge incontinence. Terazosin will dose was increased and he noted no improvement in January 2020. His urine urinary incontinence was not improved with Uroxatrol in May 2020. In August 2020, Rapaflo 8 mg improve the urgency and urge urinary incontinence. In September 2020, he noted improvement in urgency incontinence with mirabegron 25 mg. In March 2021 his urge urinary continence was stable on mirabegron. He was found to have increased urinary frequency on Jardiance that required and start wearing pull-ups.   I initially saw him in 01/2021 with IPSS score is 22, QOL 4. He had urinary frequency and urgency. He has some urgency incontinence. He wears 2 depends daily. He also complains of a weak stream. PVR was 240 mL. He is currently taking terazosin and finasteride. We attempted to obtain urodynamics however the urodynamic catheter was unable to be placed given enlarged lateral lobes. Cystoscopy 02/08/2021 demonstrated obstructing severe hyperplasia of bilateral lobes. TRUS for size 02/20/2021 with 104cm^3 prostate. I attempted to start him on Myrbetriq. He did receive some samples and noted some benefit however this was cost prohibitive to him.   He follows  up in 12/2022 with persistent urinary tract symptoms that he states have improved from last year. IPSS score is 18, quality-of-life 3. He does complain of sensation mixed with bladder emptying. PVR today is 207 mL that is stable. He also has urinary frequency, urgency, weak flow stream and 3 time nocturia. He states that his most bothersome symptom is urinary urgency.  -He continues to take terazosin and  finasteride with benefit. He is currently not taking Myrbetriq as this was cost prohibitive.   He notes that with terazosin and finasteride, he has improved flow stream however does note the stream is overall weaker. Given concern of symptoms, he would like to proceed with TURP along with fiducial marker placement to plan for upcoming EBRT.   #3. Erectile dysfunction: He also complains of erectile dysfunction. He has difficulty obtaining and maintaining erection satisfactory for intercourse. His shim score is 6. This has been treated with sildenafil in the past with benefit.     ALLERGIES: No Allergies    MEDICATIONS: Lisinopril  Myrbetriq 50 mg tablet, extended release 24 hr 1 tablet PO Daily  Terazosin Hcl  Amlodipine Besylate 10 mg tablet Oral  Aspir 81  Atorvastatin Calcium  Clopidogrel 75 mg tablet  Clotrimazole 1 % cream 1 PO Daily  Lantus 100 unit/ml vial Subcutaneous  Pantoprazole Sodium     Notes: cyclobenzaprine 10mg  one po BID    GU PSH: Cystoscopy - 2022 Prostate Needle Biopsy - 05/13/2023     NON-GU PSH: Surgical Pathology, Gross And Microscopic Examination For Prostate Needle - 05/13/2023 Visit Complexity (formerly [facility]) - 07/09/2023, 06/05/2023, 03/25/2023     GU PMH: BPH w/LUTS - 07/09/2023, - 06/05/2023, - 05/13/2023, - 12/25/2022, - 2023, - 2022, - 2022, - 2022, - 2022, - 2021, - 2021, - 2020, - 2020, - 2020, - 2020, - 2020, - 2019, - 2019, - 2018, - 2018, - 2017, Benign prostatic hyperplasia with urinary obstruction, - 2017 Prostate Cancer - 07/09/2023, - 06/05/2023 Urge incontinence - 07/09/2023, - 06/05/2023, - 12/25/2022, - 2022, - 2021, - 2021, - 2020, - 2020, - 2020, - 2020, - 2020, - 2019, - 2019 ED due to arterial insufficiency - 06/05/2023, - 12/25/2022, - 2023, - 2022, - 2022, - 2021, - 2020, - 2020, - 2020, - 2018, - 2018, - 2017, Erectile dysfunction due to arterial insufficiency, - 2017 Weak Urinary Stream - 06/05/2023, - 2023, - 2022, - 2022, - 2022 Acute  Cystitis/UTI - 03/25/2023 Encounter for Prostate Cancer screening - 12/25/2022, - 2023, - 2022, - 2022, - 2022 Nocturia - 12/25/2022, - 2022, - 2022, Nocturia, - 2017 Balanitis - 2021, - 2020 Urinary Urgency - 2019, - 2019, - 2018, - 2018, - 2017, Urinary urgency, - 2017 Urinary Frequency, Increased urinary frequency - 2015 Bladder, Neuromuscular dysfunction, Unspec, Neurogenic bladder - 2014 Polyuria, Polyuria - 2014      PMH Notes:  2007-06-23 14:04:32 - Note: Arthritis  chronic kidney disease     NON-GU PMH: Encounter for general adult medical examination without abnormal findings, Encounter for preventive health examination - 2017 Personal history of other diseases of the circulatory system, History of hypertension - 2014 Personal history of other diseases of the nervous system and sense organs, History of sleep apnea - 2014 Personal history of other endocrine, nutritional and metabolic disease, History of diabetes mellitus - 2014, History of hypercholesterolemia, - 2014 Personal history of other mental and behavioral disorders, History of depression - 2014 Personal history of other specified conditions, History of heartburn -  2014    FAMILY HISTORY: 1 stepdaughter - Runs in Family Death - Mother, Father Family Health Status Number - Father Prostate Cancer - Runs In Family   SOCIAL HISTORY: Marital Status: Married Preferred Language: English; Ethnicity: Not Hispanic Or Latino; Race: Black or African American Current Smoking Status: Patient does not smoke anymore.   Tobacco Use Assessment Completed: Used Tobacco in last 30 days? Social Drinker.  Does not use drugs. Drinks 3 caffeinated drinks per day. Has not had a blood transfusion. Patient's occupation is/was Retired.     Notes: ETOH liquor several times per month    REVIEW OF SYSTEMS:    GU Review Male:   Patient denies frequent urination, erection problems, have to strain to urinate , trouble starting your stream, stream  starts and stops, penile pain, leakage of urine, hard to postpone urination, get up at night to urinate, and burning/ pain with urination.  Gastrointestinal (Upper):   Patient denies nausea, vomiting, and indigestion/ heartburn.  Gastrointestinal (Lower):   Patient denies diarrhea and constipation.  Constitutional:   Patient denies fever, night sweats, weight loss, and fatigue.  Skin:   Patient denies skin rash/ lesion and itching.  Eyes:   Patient denies blurred vision and double vision.  Ears/ Nose/ Throat:   Patient denies sore throat and sinus problems.  Hematologic/Lymphatic:   Patient denies swollen glands and easy bruising.  Cardiovascular:   Patient denies leg swelling and chest pains.  Respiratory:   Patient denies cough and shortness of breath.  Endocrine:   Patient denies excessive thirst.  Musculoskeletal:   Patient denies back pain and joint pain.  Neurological:   Patient denies headaches and dizziness.  Psychologic:   Patient denies depression and anxiety.   VITAL SIGNS:      08/19/2023 12:53 PM  BP 158/82 mmHg  Pulse 74 /min  Temperature 97.5 F / 36.3 C   MULTI-SYSTEM PHYSICAL EXAMINATION:    Constitutional: Well-nourished. No physical deformities. Normally developed. Good grooming.  Neck: Neck symmetrical, not swollen. Normal tracheal position.  Respiratory: Normal breath sounds. No labored breathing, no use of accessory muscles.   Cardiovascular: Regular rate and rhythm. No murmur, no gallop.  Lymphatic: No enlargement of neck, axillae, groin.  Skin: No paleness, no jaundice, no cyanosis. No lesion, no ulcer, no rash.  Neurologic / Psychiatric: Oriented to time, oriented to place, oriented to person. No depression, no anxiety, no agitation.  Gastrointestinal: No mass, no tenderness, no rigidity, non obese abdomen.  Eyes: Normal conjunctivae. Normal eyelids.  Ears, Nose, Mouth, and Throat: Left ear no scars, no lesions, no masses. Right ear no scars, no lesions, no  masses. Nose no scars, no lesions, no masses. Normal hearing. Normal lips.  Musculoskeletal: Normal gait and station of head and neck.     Complexity of Data:  Records Review:   Previous Patient Records  Urine Test Review:   Urinalysis   08/19/23  Urinalysis  Urine Appearance Clear   Urine Color Yellow   Urine Glucose Neg mg/dL  Urine Bilirubin Neg mg/dL  Urine Ketones Neg mg/dL  Urine Specific Gravity 1.015   Urine Blood Neg ery/uL  Urine pH 6.0   Urine Protein Trace mg/dL  Urine Urobilinogen 0.2 mg/dL  Urine Nitrites Neg   Urine Leukocyte Esterase Neg leu/uL   PROCEDURES:          Urinalysis - 81003 Dipstick Dipstick Cont'd  Color: Yellow Bilirubin: Neg mg/dL  Appearance: Clear Ketones: Neg mg/dL  Specific Gravity: 6.578 Blood:  Neg ery/uL  pH: 6.0 Protein: Trace mg/dL  Glucose: Neg mg/dL Urobilinogen: 0.2 mg/dL    Nitrites: Neg    Leukocyte Esterase: Neg leu/uL    ASSESSMENT:      ICD-10 Details  1 GU:   Prostate Cancer - C61   2   BPH w/LUTS - N40.1    PLAN:           Schedule Return Visit/Planned Activity: Keep Scheduled Appointment - Schedule Surgery          Document Letter(s):  Created for Patient: Clinical Summary         Notes:   There are no changes in the patients history or physical exam since last evaluation by Christian Mclaughlin. Pt is scheduled to undergo TURP, markers, and space oar on 08/25/23.   All pt's questions were answered to the best of my ability.          Next Appointment:      Next Appointment: 08/25/2023 12:00 PM    Appointment Type: Surgery     Location: [facility] [phone]    Provider: Jettie Mclaughlin, M.D.    Reason for Visit: NE/OBS BIPOALR TURP, FIDUCIAL MARKERS, SPACE OAR    Urology Preoperative H&P   Chief Complaint: BPH and prostate cancer  History of Present Illness: Christian Mclaughlin is a 77 y.o. male with BPH and prostate cancer with TRUS size 113cm with significant LUTS here for TURP and fiducial markers  with SpaceOAR injection. Denies fevers, chills, dysuria.    Past Medical History:  Diagnosis Date   BPH with obstruction/lower urinary tract symptoms    CKD (chronic kidney disease), stage III (HCC)    DDD (degenerative disc disease), cervical    ED (erectile dysfunction)    History of CVA (cerebrovascular accident) without residual deficits 05/31/2018   followed by pcp at [facility];   admission in epic;   left MCA pontine paramedian infarct along with remote punctate lacunar infart left inferanl capsule secondary to small vessel disease;  no residual   Hyperlipidemia    Hypertension    Malignant neoplasm prostate (HCC) 04/2023   urologist-- dr Tascha Casares/  radiation oncologist--- dr Christian Mclaughlin;  dx 08/ 2024, gleason 3+4,  psa 5,  vol  113 cc   OSA on CPAP    [facility]   (08-14-2023  pt stated uses 5 out 7 days per week)   Type 2 diabetes mellitus treated with insulin (HCC)    [facility];    (08-14-2023  pt stated check blood sugar multiple times daily w/ Josephine Igo 3,  fasting average 100-140)   Urge urinary incontinence    Wears glasses    Wears partial dentures    upper and lower    Past Surgical History:  Procedure Laterality Date   COLONOSCOPY  2015   approx   SOFT TISSUE MASS EXCISION Left    1970s;   left breast area,  pt stated benign    Allergies: No Known Allergies  History reviewed. No pertinent family history.  Social History:  reports that he quit smoking about 12 years ago. His smoking use included cigarettes. He started smoking about 56 years ago. He has never used smokeless tobacco. He reports current alcohol use. He reports that he does not use drugs.  ROS: A complete review of systems was performed.  All systems are negative except for pertinent findings as noted.  Physical Exam:  Vital signs in last 24 hours: Temp:  [  97.5 F (36.4 C)] 97.5 F (36.4 C) (12/02 1035) Pulse Rate:  [72] 72 (12/02 1035) Resp:  [17] 17 (12/02 1035) BP: (162)/(91) 162/91 (12/02  1035) SpO2:  [98 %] 98 % (12/02 1035) Weight:  [89.4 kg] 89.4 kg (12/02 1035) Constitutional:  Alert and oriented, No acute distress Cardiovascular: Regular rate and rhythm Respiratory: Normal respiratory effort, Lungs clear bilaterally GI: Abdomen is soft, nontender, nondistended, no abdominal masses GU: No CVA tenderness Lymphatic: No lymphadenopathy Neurologic: Grossly intact, no focal deficits Psychiatric: Normal mood and affect  Laboratory Data:  Recent Labs    08/25/23 1027  HGB 15.3  HCT 45.0    Recent Labs    08/25/23 1027  NA 140  K 4.6  CL 105  GLUCOSE 177*  BUN 19  CREATININE 1.30*     Results for orders placed or performed during the hospital encounter of 08/25/23 (from the past 24 hour(s))  I-STAT, chem 8     Status: Abnormal   Collection Time: 08/25/23 10:27 AM  Result Value Ref Range   Sodium 140 135 - 145 mmol/L   Potassium 4.6 3.5 - 5.1 mmol/L   Chloride 105 98 - 111 mmol/L   BUN 19 8 - 23 mg/dL   Creatinine, Ser 5.28 (H) 0.61 - 1.24 mg/dL   Glucose, Bld 413 (H) 70 - 99 mg/dL   Calcium, Ion 2.44 0.10 - 1.40 mmol/L   TCO2 25 22 - 32 mmol/L   Hemoglobin 15.3 13.0 - 17.0 g/dL   HCT 27.2 53.6 - 64.4 %   No results found for this or any previous visit (from the past 240 hour(s)).  Renal Function: Recent Labs    08/25/23 1027  CREATININE 1.30*   Estimated Creatinine Clearance: 54.3 mL/min (A) (by C-G formula based on SCr of 1.3 mg/dL (H)).  Radiologic Imaging: No results found.  I independently reviewed the above imaging studies.  Assessment and Plan Christian Mclaughlin is a 77 y.o. male with BPH and prostate cancer here for TURP and fiducial markers with SpaceOAR.   Risks and benefits of Transurethral Resection of the Prostate were reviewed in detail including infection, bleeding, blood transfusion, injury to bladder/urethra/surrounding structures, erectile dysfunction, urinary incontinence, bladder neck contracture, persistent obstructive and  irritative voiding symptoms, and global anesthesia risks including but not limited to CVA, MI, DVT, PE, pneumonia, and death. He expressed understanding and desire to proceed.   The patient has decided to proceed with EBRT and SpaceOAR placement as primary treatment of his intermediate risk prostate cancer.  The risks, benefits and alternatives of the aforementioned procedures was discussed in detail.  Risks include, but are not limited to worsening LUTS, erectile dysfunction, rectal irritation, urethral stricture formation, fistula formation, cancer recurrence, MI, CVA, PE, DVT and the inherent risk of general anesthesia.  He voices understanding and wishes to proceed.     Matt R. Irish Breisch MD 08/25/2023, 10:57 AM  [facility] Pager: [phone]

## 2023-08-25 NOTE — Op Note (Signed)
Operative Note  Preoperative diagnosis:  1.  BPH with bladder outlet obstruction 2. Prostate cancer  Postoperative diagnosis: 1.  BPH with bladder outlet obstruction 2. Prostate cancer  Procedure(s): 1.  Bipolar transurethral resection of prostate 2. Fiducial markers and Space OAR insertion  Surgeon: Jettie Pagan, MD  Assistants:  None  Anesthesia:  General  Complications:  None  EBL:  10ml  Specimens: 1. Prostate chips ID Type Source Tests Collected by Time Destination  1 : Prostate chips Tissue PATH Prostate TURP SURGICAL PATHOLOGY Jannifer Hick, MD 08/25/2023 1244    Drains/Catheters: 1.  22Fr 3 way catheter with 30ml water into balloon  Intraoperative findings:   Trilobar obstructing prostate with large intravesical portion, excellent resection with excellent hemostasis. Placement of fiducial markers and Space OAR with no complications.  Indication:  Christian Mclaughlin is a 77 y.o. male with BPH with bladder outlet obstruction presenting for transurethral resection of the prostate. He had a TRUS volume of 113cc. After thorough discussion including all relevant risk benefits and alternatives, he presents today for a bipolar TURP and fiducial markers with Space OAR insertion.  Description of procedure: The indication, alternatives, benefits and risks were discussed with the patient and informed consent was obtained.  Patient was brought to the operating room table, positioned supine, secured with a safety strap.  Pneumatic compression devices were placed on the lower extremities.  After the administration of intravenous antibiotics and general anesthesia, the patient was repositioned into the dorsal lithotomy position.  All pressure points were carefully padded.  A rectal examination was performed confirming a smooth symmetric enlarged gland.  The genitalia were prepped and draped in standard sterile manner.  A timeout was completed, verifying the correct patient, surgical  procedure and positioning prior to beginning the procedure.  Isotonic sodium chloride was used for irrigation. Zenaida Niece buren sounds were used to dilate the urethra.  A 26 French continuous-flow resectoscope sheath with the visual obturator and a 30 degree lens was advanced under direct vision into the bladder.  The anterior urethra appeared normal in its entirety.The prostatic urethra was elongated with trilobar hyperplasia.  On cystoscopic evaluation, his bladder capacity appeared normal, the bladder wall was noted to expand symmetrically in all dimensions.  There were no tumors, stones or foreign bodies present. The bladder was trabeculated with normal-appearing mucosa.  Both ureteral orifices were in their normal anatomic positions with clear urinary reflux noted bilaterally.  The obturator was removed and replaced by the working element with a resection loop.  The location of the ureteral orifices and the prostatic configuration were again confirmed.  Starting at the bladder neck and proceeding distally to the verumontanum a transurethral section of the prostate was performed using bipolar using energy of 4 and 5 for cutting and coagulation, respectively.  The procedure began at the bladder neck at the 5 o'clock and 7 o'clock positions and carefully carried distally to the verumontanum, resecting the intervening prostatic adenoma.  Next the left lateral lobe was resected to the level of the transverse capsular fibers.  The identical procedure was performed on the right lobe.  Attention was then directed anteriorly and the resection was completed from the 10 o'clock to 2 o'clock positions.  All bleeding vessels were fulgurated achieving meticulous hemostasis.  The bladder was irrigated with a Toomey syringe, ensuring removal of all prostate chips which were sent to pathology for evaluation.  Having completed the resection and the chips removed, we again confirmed hemostasis with the loop with  coagulating  current.  Upon completion of the entire procedure, the bladder and posterior urethra were reexamined, confirming open prostatic urethra and bladder neck without evidence of bleeding or perforation.  Both ureteral orifices and the external sphincter were noted to be intact.  The resectoscope was withdrawn under direct vision and a 22 French three-way Foley catheter with a 30 cc balloon was inserted into the bladder.  The balloon was inflated with 30 cc of sterile water.  After multiple manual irrigations ensuring clear return of the irrigant, the procedure was terminated.  The catheter was attached to a drainage bag and continuous bladder irrigation was started with normal saline.    Next, transrectal ultrasonography was utilized to visualize the prostate. Three gold fiducial markers were then placed into the prostate via transperineal needles under ultrasound guidance at the right apex, right base, and left mid gland under direct ultrasound guidance. A site in the midline was then selected on the perineum for placement of an 18 g needle with saline. The needle was advanced above the rectum and below Denonvillier's fascia to the mid gland and confirmed to be in the midline on transverse imaging. One cc of saline was injected confirming appropriate expansion of this space. A total of 5 cc of saline was then injected to open the space further bilaterally. The saline syringe was then removed and the SpaceOAR hydrogel was injected with good distribution bilaterally. He tolerated the procedure well and without complications. He was given a voiding trial prior to discharge from the PACU.  Plan: Continuous bladder irrigation overnight with gentle Foley traction.  Plan to discharge home tomorrow with Foley catheter in place and void trial in the office in 3 days.  Matt R. Khiry Pasquariello MD [facility]  Pager: [phone]

## 2023-08-26 DIAGNOSIS — C61 Malignant neoplasm of prostate: Secondary | ICD-10-CM | POA: Diagnosis not present

## 2023-08-26 LAB — BASIC METABOLIC PANEL
Anion gap: 8 (ref 5–15)
BUN: 19 mg/dL (ref 8–23)
CO2: 22 mmol/L (ref 22–32)
Calcium: 8.9 mg/dL (ref 8.9–10.3)
Chloride: 105 mmol/L (ref 98–111)
Creatinine, Ser: 1.34 mg/dL — ABNORMAL HIGH (ref 0.61–1.24)
GFR, Estimated: 55 mL/min — ABNORMAL LOW (ref >60)
Glucose, Bld: 180 mg/dL — ABNORMAL HIGH (ref 70–99)
Potassium: 4.2 mmol/L (ref 3.5–5.1)
Sodium: 135 mmol/L (ref 135–145)

## 2023-08-26 LAB — CBC
HCT: 40.9 % (ref 39.0–52.0)
Hemoglobin: 13.5 g/dL (ref 13.0–17.0)
MCH: 26.9 pg (ref 26.0–34.0)
MCHC: 33 g/dL (ref 30.0–36.0)
MCV: 81.6 fL (ref 80.0–100.0)
Platelets: 216 10*3/uL (ref 150–400)
RBC: 5.01 MIL/uL (ref 4.22–5.81)
RDW: 13.3 % (ref 11.5–15.5)
WBC: 7.9 10*3/uL (ref 4.0–10.5)
nRBC: 0 % (ref 0.0–0.2)

## 2023-08-26 LAB — HEMOGLOBIN A1C
Hgb A1c MFr Bld: 7.7 % — ABNORMAL HIGH (ref 4.8–5.6)
Mean Plasma Glucose: 174.29 mg/dL

## 2023-08-26 LAB — GLUCOSE, CAPILLARY
Glucose-Capillary: 158 mg/dL — ABNORMAL HIGH (ref 70–99)
Glucose-Capillary: 153 mg/dL — ABNORMAL HIGH (ref 70–99)

## 2023-08-26 MED ORDER — OXYCODONE-ACETAMINOPHEN 5-325 MG PO TABS
ORAL_TABLET | ORAL | Status: AC
  Filled 2023-08-26: qty 1

## 2023-08-26 NOTE — Discharge Summary (Signed)
Date of admission: 08/25/2023  Date of discharge: 08/26/2023  Admission diagnosis: BPH, prostate cancer  Discharge diagnosis: BPH, prostate cancer  Secondary diagnoses: none  History and Physical: For full details, please see admission history and physical. Briefly, Christian Mclaughlin is a 77 y.o. year old patient with BPH and prostate cancer who underwent TURP and fiducial markers with Space OAR.   Hospital Course: The patient recovered in the usual expected fashion.  He had his diet advanced slowly.  Initially managed with IV pain control, then transitioned to PO meds when he was tolerating oral intake.  His labs were stable throughout the hospital course.  He was discharged to home on POD#1.  At the time of discharge the patient was tolerating a regular diet, passing flatus, ambulating, had adequate pain control and was agreeable to discharge.  Follow up as scheduled.    Laboratory values:  Recent Labs    08/25/23 1027 08/26/23 0029  HGB 15.3 13.5  HCT 45.0 40.9   Recent Labs    08/25/23 1027 08/26/23 0029  CREATININE 1.30* 1.34*    Disposition: Home  Discharge instruction: The patient was instructed to be ambulatory but told to refrain from heavy lifting, strenuous activity, or driving.   Discharge medications:  Allergies as of 08/26/2023   No Known Allergies      Medication List     TAKE these medications    amLODipine 10 MG tablet Commonly known as: NORVASC Take 10 mg by mouth daily.   aspirin EC 81 MG tablet Take 81 mg by mouth daily.   atorvastatin 40 MG tablet Commonly known as: LIPITOR Take 1 tablet (40 mg total) by mouth daily at 6 PM. What changed: when to take this   carbamide peroxide 6.5 % OTIC solution Commonly known as: DEBROX Place 5-10 drops into both ears 2 (two) times daily as needed.   clopidogrel 75 MG tablet Commonly known as: PLAVIX Take 1 tablet (75 mg total) by mouth daily.   cyclobenzaprine 10 MG tablet Commonly known as:  FLEXERIL Take 10 mg by mouth 2 (two) times daily as needed for muscle spasms. Evening and HS only   docusate sodium 100 MG capsule Commonly known as: Colace Take 1 capsule (100 mg total) by mouth daily as needed for up to 30 doses.   ferrous sulfate 325 (65 FE) MG EC tablet Take 325 mg by mouth daily.   Fiber 625 MG Tabs Take 2 tablets by mouth daily.   finasteride 5 MG tablet Commonly known as: PROSCAR Take 5 mg by mouth daily.   lisinopril 40 MG tablet Commonly known as: ZESTRIL Take 40 mg by mouth daily.   metoprolol succinate 50 MG 24 hr tablet Commonly known as: TOPROL-XL Take 25 mg by mouth daily. Takes half tablet  daily   miconazole 2 % powder Commonly known as: MICOTIN Apply topically 2 (two) times daily as needed for itching (goin rash).   oxyCODONE-acetaminophen 5-325 MG tablet Commonly known as: Percocet Take 1 tablet by mouth every 4 (four) hours as needed for up to 12 doses for severe pain (pain score 7-10).   PRESCRIPTION MEDICATION Take 4 sprays by mouth 4 (four) times daily as needed. ARTIFICIAL SALIVA ORAL SPRAY  ([facility]S FROM [facility])   Semaglutide (1 MG/DOSE) 4 MG/3ML Sopn Inject 1 mg into the skin once a week. Wednesday's  (ozempic)   Semglee (yfgn) 100 UNIT/ML Pen Generic drug: insulin glargine-yfgn Inject 17 Units into the skin at bedtime. [facility] substitute for  Lantus   terazosin 5 MG capsule Commonly known as: HYTRIN Take 5 mg by mouth at bedtime.        Followup:   Follow-up Information     [facility] Follow up on 08/28/2023.   Why: 9:15AM Contact information: [address] [place] [address] [phone]                Matt R. Charlesia Canaday MD [facility]  Pager: [phone]

## 2023-08-27 ENCOUNTER — Encounter (HOSPITAL_BASED_OUTPATIENT_CLINIC_OR_DEPARTMENT_OTHER): Payer: Self-pay | Admitting: Urology

## 2023-08-27 LAB — SURGICAL PATHOLOGY

## 2023-08-29 NOTE — Progress Notes (Addendum)
Patient proceed with TURP on 12/2 and recently had urology follow up on 12/5.  Patient tolerated well and will have additional follow up with urology on 1/6 prior to his CT Simulation.    RN left message for call back.   RN spoke with patient this evening and provided additional education on next steps with CT Simulation and daily radiation.

## 2023-09-22 ENCOUNTER — Encounter (HOSPITAL_BASED_OUTPATIENT_CLINIC_OR_DEPARTMENT_OTHER): Payer: Self-pay | Admitting: Emergency Medicine

## 2023-09-22 ENCOUNTER — Inpatient Hospital Stay (HOSPITAL_BASED_OUTPATIENT_CLINIC_OR_DEPARTMENT_OTHER)
Admission: EM | Admit: 2023-09-22 | Discharge: 2023-09-25 | DRG: 920 | Disposition: A | Discharge status: Home / Self Care | Payer: Medicare Other | Attending: Internal Medicine | Admitting: Internal Medicine

## 2023-09-22 ENCOUNTER — Other Ambulatory Visit: Payer: Self-pay

## 2023-09-22 DIAGNOSIS — Z7985 Long-term (current) use of injectable non-insulin antidiabetic drugs: Secondary | ICD-10-CM | POA: Diagnosis not present

## 2023-09-22 DIAGNOSIS — I129 Hypertensive chronic kidney disease with stage 1 through stage 4 chronic kidney disease, or unspecified chronic kidney disease: Secondary | ICD-10-CM | POA: Diagnosis present

## 2023-09-22 DIAGNOSIS — Z7902 Long term (current) use of antithrombotics/antiplatelets: Secondary | ICD-10-CM | POA: Diagnosis not present

## 2023-09-22 DIAGNOSIS — N179 Acute kidney failure, unspecified: Secondary | ICD-10-CM | POA: Diagnosis present

## 2023-09-22 DIAGNOSIS — C61 Malignant neoplasm of prostate: Secondary | ICD-10-CM | POA: Diagnosis present

## 2023-09-22 DIAGNOSIS — E1122 Type 2 diabetes mellitus with diabetic chronic kidney disease: Secondary | ICD-10-CM | POA: Diagnosis present

## 2023-09-22 DIAGNOSIS — Z9079 Acquired absence of other genital organ(s): Secondary | ICD-10-CM

## 2023-09-22 DIAGNOSIS — Z87891 Personal history of nicotine dependence: Secondary | ICD-10-CM

## 2023-09-22 DIAGNOSIS — Z7982 Long term (current) use of aspirin: Secondary | ICD-10-CM | POA: Diagnosis not present

## 2023-09-22 DIAGNOSIS — B952 Enterococcus as the cause of diseases classified elsewhere: Secondary | ICD-10-CM | POA: Diagnosis present

## 2023-09-22 DIAGNOSIS — D62 Acute posthemorrhagic anemia: Secondary | ICD-10-CM | POA: Diagnosis present

## 2023-09-22 DIAGNOSIS — G4733 Obstructive sleep apnea (adult) (pediatric): Secondary | ICD-10-CM | POA: Diagnosis present

## 2023-09-22 DIAGNOSIS — R319 Hematuria, unspecified: Secondary | ICD-10-CM | POA: Diagnosis present

## 2023-09-22 DIAGNOSIS — I10 Essential (primary) hypertension: Secondary | ICD-10-CM | POA: Diagnosis present

## 2023-09-22 DIAGNOSIS — Z972 Presence of dental prosthetic device (complete) (partial): Secondary | ICD-10-CM

## 2023-09-22 DIAGNOSIS — R310 Gross hematuria: Principal | ICD-10-CM | POA: Diagnosis present

## 2023-09-22 DIAGNOSIS — N390 Urinary tract infection, site not specified: Secondary | ICD-10-CM | POA: Diagnosis present

## 2023-09-22 DIAGNOSIS — Z8546 Personal history of malignant neoplasm of prostate: Secondary | ICD-10-CM

## 2023-09-22 DIAGNOSIS — E785 Hyperlipidemia, unspecified: Secondary | ICD-10-CM | POA: Diagnosis present

## 2023-09-22 DIAGNOSIS — E871 Hypo-osmolality and hyponatremia: Secondary | ICD-10-CM | POA: Diagnosis present

## 2023-09-22 DIAGNOSIS — N400 Enlarged prostate without lower urinary tract symptoms: Secondary | ICD-10-CM | POA: Diagnosis present

## 2023-09-22 DIAGNOSIS — K5900 Constipation, unspecified: Secondary | ICD-10-CM | POA: Diagnosis present

## 2023-09-22 DIAGNOSIS — I639 Cerebral infarction, unspecified: Secondary | ICD-10-CM | POA: Diagnosis present

## 2023-09-22 DIAGNOSIS — E119 Type 2 diabetes mellitus without complications: Secondary | ICD-10-CM

## 2023-09-22 DIAGNOSIS — N3289 Other specified disorders of bladder: Secondary | ICD-10-CM | POA: Diagnosis present

## 2023-09-22 DIAGNOSIS — Z8673 Personal history of transient ischemic attack (TIA), and cerebral infarction without residual deficits: Secondary | ICD-10-CM | POA: Diagnosis not present

## 2023-09-22 DIAGNOSIS — Z79899 Other long term (current) drug therapy: Secondary | ICD-10-CM | POA: Diagnosis not present

## 2023-09-22 DIAGNOSIS — Z794 Long term (current) use of insulin: Secondary | ICD-10-CM

## 2023-09-22 DIAGNOSIS — N99820 Postprocedural hemorrhage and hematoma of a genitourinary system organ or structure following a genitourinary system procedure: Secondary | ICD-10-CM | POA: Diagnosis present

## 2023-09-22 DIAGNOSIS — B9689 Other specified bacterial agents as the cause of diseases classified elsewhere: Secondary | ICD-10-CM | POA: Diagnosis present

## 2023-09-22 DIAGNOSIS — N1831 Chronic kidney disease, stage 3a: Secondary | ICD-10-CM | POA: Diagnosis present

## 2023-09-22 LAB — CBC WITH DIFFERENTIAL/PLATELET
Abs Immature Granulocytes: 0.07 10*3/uL (ref 0.00–0.07)
Basophils Absolute: 0 10*3/uL (ref 0.0–0.1)
Basophils Relative: 0 %
Eosinophils Absolute: 0 10*3/uL (ref 0.0–0.5)
Eosinophils Relative: 0 %
HCT: 33.7 % — ABNORMAL LOW (ref 39.0–52.0)
Hemoglobin: 11.3 g/dL — ABNORMAL LOW (ref 13.0–17.0)
Immature Granulocytes: 1 %
Lymphocytes Relative: 10 %
Lymphs Abs: 1.5 10*3/uL (ref 0.7–4.0)
MCH: 26.2 pg (ref 26.0–34.0)
MCHC: 33.5 g/dL (ref 30.0–36.0)
MCV: 78 fL — ABNORMAL LOW (ref 80.0–100.0)
Monocytes Absolute: 0.9 10*3/uL (ref 0.1–1.0)
Monocytes Relative: 6 %
Neutro Abs: 13 10*3/uL — ABNORMAL HIGH (ref 1.7–7.7)
Neutrophils Relative %: 83 %
Platelets: 503 10*3/uL — ABNORMAL HIGH (ref 150–400)
RBC: 4.32 MIL/uL (ref 4.22–5.81)
RDW: 13.5 % (ref 11.5–15.5)
WBC: 15.5 10*3/uL — ABNORMAL HIGH (ref 4.0–10.5)
nRBC: 0 % (ref 0.0–0.2)

## 2023-09-22 LAB — COMPREHENSIVE METABOLIC PANEL
ALT: 29 U/L (ref 0–44)
AST: 20 U/L (ref 15–41)
Albumin: 3 g/dL — ABNORMAL LOW (ref 3.5–5.0)
Alkaline Phosphatase: 72 U/L (ref 38–126)
Anion gap: 10 (ref 5–15)
BUN: 23 mg/dL (ref 8–23)
CO2: 21 mmol/L — ABNORMAL LOW (ref 22–32)
Calcium: 8.9 mg/dL (ref 8.9–10.3)
Chloride: 102 mmol/L (ref 98–111)
Creatinine, Ser: 2.03 mg/dL — ABNORMAL HIGH (ref 0.61–1.24)
GFR, Estimated: 34 mL/min — ABNORMAL LOW (ref >60)
Glucose, Bld: 181 mg/dL — ABNORMAL HIGH (ref 70–99)
Potassium: 4.4 mmol/L (ref 3.5–5.1)
Sodium: 133 mmol/L — ABNORMAL LOW (ref 135–145)
Total Bilirubin: 0.9 mg/dL (ref 0.0–1.2)
Total Protein: 8 g/dL (ref 6.5–8.1)

## 2023-09-22 LAB — GLUCOSE, CAPILLARY: Glucose-Capillary: 200 mg/dL — ABNORMAL HIGH (ref 70–99)

## 2023-09-22 LAB — URINALYSIS, ROUTINE W REFLEX MICROSCOPIC
Glucose, UA: NEGATIVE mg/dL
Ketones, ur: 15 mg/dL — AB
Nitrite: POSITIVE — AB
Protein, ur: >=300 mg/dL — AB
Specific Gravity, Urine: 1.025 (ref 1.005–1.030)
pH: 5.5 (ref 5.0–8.0)

## 2023-09-22 LAB — URINALYSIS, MICROSCOPIC (REFLEX)
RBC / HPF: >50 RBC/hpf (ref 0–5)
WBC, UA: >50 WBC/hpf (ref 0–5)

## 2023-09-22 MED ORDER — ACETAMINOPHEN 650 MG RE SUPP: 650.0000 mg | Freq: Four times a day (QID) | RECTAL | Status: DC | PRN

## 2023-09-22 MED ORDER — SODIUM CHLORIDE 0.9 % IV SOLN
1.0000 g | Freq: Once | INTRAVENOUS | Status: AC
  Administered 2023-09-22: 1 g via INTRAVENOUS
  Filled 2023-09-22: qty 10

## 2023-09-22 MED ORDER — SODIUM CHLORIDE 0.9% FLUSH
3.0000 mL | Freq: Two times a day (BID) | INTRAVENOUS | Status: DC
  Administered 2023-09-23 – 2023-09-24 (×2): 3 mL via INTRAVENOUS

## 2023-09-22 MED ORDER — SODIUM CHLORIDE 0.9 % IV BOLUS
1000.0000 mL | Freq: Once | INTRAVENOUS | Status: AC
  Administered 2023-09-22: 1000 mL via INTRAVENOUS

## 2023-09-22 MED ORDER — FERROUS SULFATE 325 (65 FE) MG PO TABS
325.0000 mg | ORAL_TABLET | Freq: Every day | ORAL | Status: DC
  Administered 2023-09-23 – 2023-09-25 (×3): 325 mg via ORAL
  Filled 2023-09-22 (×3): qty 1

## 2023-09-22 MED ORDER — ONDANSETRON HCL 4 MG PO TABS: 4.0000 mg | ORAL_TABLET | Freq: Four times a day (QID) | ORAL | Status: DC | PRN

## 2023-09-22 MED ORDER — SODIUM CHLORIDE 0.9 % IV SOLN: INTRAVENOUS | Status: AC

## 2023-09-22 MED ORDER — FINASTERIDE 5 MG PO TABS
5.0000 mg | ORAL_TABLET | Freq: Every day | ORAL | Status: DC
  Administered 2023-09-23 – 2023-09-25 (×3): 5 mg via ORAL
  Filled 2023-09-22 (×3): qty 1

## 2023-09-22 MED ORDER — SODIUM CHLORIDE 0.9 % IV SOLN: Freq: Once | INTRAVENOUS | Status: AC

## 2023-09-22 MED ORDER — ONDANSETRON HCL 4 MG/2ML IJ SOLN: 4.0000 mg | Freq: Four times a day (QID) | INTRAMUSCULAR | Status: DC | PRN

## 2023-09-22 MED ORDER — INSULIN ASPART 100 UNIT/ML IJ SOLN
0.0000 [IU] | Freq: Three times a day (TID) | INTRAMUSCULAR | Status: DC
Start: 2023-09-23 — End: 2023-09-24
  Administered 2023-09-23: 3 [IU] via SUBCUTANEOUS
  Administered 2023-09-23: 2 [IU] via SUBCUTANEOUS
  Administered 2023-09-23: 5 [IU] via SUBCUTANEOUS
  Administered 2023-09-24: 3 [IU] via SUBCUTANEOUS

## 2023-09-22 MED ORDER — ATORVASTATIN CALCIUM 40 MG PO TABS
40.0000 mg | ORAL_TABLET | Freq: Every day | ORAL | Status: DC
  Administered 2023-09-23 – 2023-09-24 (×2): 40 mg via ORAL
  Filled 2023-09-22 (×2): qty 1

## 2023-09-22 MED ORDER — INSULIN GLARGINE-YFGN 100 UNIT/ML ~~LOC~~ SOLN
17.0000 [IU] | Freq: Every day | SUBCUTANEOUS | Status: DC
  Administered 2023-09-23 (×2): 17 [IU] via SUBCUTANEOUS
  Filled 2023-09-22 (×3): qty 0.17

## 2023-09-22 MED ORDER — LIDOCAINE HCL URETHRAL/MUCOSAL 2 % EX GEL
1.0000 | Freq: Once | CUTANEOUS | Status: AC
  Administered 2023-09-22: 1 via URETHRAL
  Filled 2023-09-22: qty 11

## 2023-09-22 MED ORDER — INSULIN ASPART 100 UNIT/ML IJ SOLN: 0.0000 [IU] | Freq: Every day | INTRAMUSCULAR | Status: DC

## 2023-09-22 MED ORDER — OXYCODONE-ACETAMINOPHEN 5-325 MG PO TABS: 1.0000 | ORAL_TABLET | ORAL | Status: DC | PRN

## 2023-09-22 MED ORDER — METOPROLOL SUCCINATE ER 25 MG PO TB24
25.0000 mg | ORAL_TABLET | Freq: Every day | ORAL | Status: DC
  Administered 2023-09-23 – 2023-09-25 (×3): 25 mg via ORAL
  Filled 2023-09-22 (×3): qty 1

## 2023-09-22 MED ORDER — TERAZOSIN HCL 5 MG PO CAPS
5.0000 mg | ORAL_CAPSULE | Freq: Every day | ORAL | Status: DC
  Administered 2023-09-23 – 2023-09-24 (×3): 5 mg via ORAL
  Filled 2023-09-22 (×4): qty 1

## 2023-09-22 MED ORDER — AMLODIPINE BESYLATE 10 MG PO TABS
10.0000 mg | ORAL_TABLET | Freq: Every day | ORAL | Status: DC
  Administered 2023-09-23: 10 mg via ORAL
  Filled 2023-09-22: qty 1

## 2023-09-22 MED ORDER — DOCUSATE SODIUM 100 MG PO CAPS: 100.0000 mg | ORAL_CAPSULE | Freq: Every day | ORAL | Status: DC | PRN

## 2023-09-22 MED ORDER — ACETAMINOPHEN 325 MG PO TABS: 650.0000 mg | ORAL_TABLET | Freq: Four times a day (QID) | ORAL | Status: DC | PRN

## 2023-09-22 MED ORDER — CYCLOBENZAPRINE HCL 10 MG PO TABS: 10.0000 mg | ORAL_TABLET | Freq: Two times a day (BID) | ORAL | Status: DC | PRN

## 2023-09-22 MED ORDER — SODIUM CHLORIDE 0.9 % IV SOLN
1.0000 g | INTRAVENOUS | Status: DC
  Administered 2023-09-23 – 2023-09-24 (×2): 1 g via INTRAVENOUS
  Filled 2023-09-22 (×2): qty 10

## 2023-09-22 NOTE — Progress Notes (Signed)
Report from Dr. Silverio Lay:  Reason for transfer: AKI , hematuria. Basically UTI  77 yo. On aspirin, plavix. Had TURP done dec 7. Hematuria resolved.  3 days ago large clot and hematuria. US showed clot. S.p three way  cath. S.p. irrigation . Hour later hematuria came back. Creat upto 2.0 from 1.3.   S/p rocephin  CBI deferred to urology (Dr. Annabell Howells consulted, they requested hospitalist service admin)  Currently on manual irrigation every 4 hours.  Plan: will admit to telemetry bed. Consider type and screen once patinet at [facility]. Consider bed side urology evaluation. _________________________________________________________________________________  Labs on Admission:  Results for orders placed or performed during the hospital encounter of 09/22/23 (from the past 24 hours)  Urinalysis, Routine w reflex microscopic -Urine, Clean Catch     Status: Abnormal   Collection Time: 09/22/23  3:50 PM  Result Value Ref Range   Color, Urine RED (A) YELLOW   APPearance CLOUDY (A) CLEAR   Specific Gravity, Urine 1.025 1.005 - 1.030   pH 5.5 5.0 - 8.0   Glucose, UA NEGATIVE NEGATIVE mg/dL   Hgb urine dipstick LARGE (A) NEGATIVE   Bilirubin Urine SMALL (A) NEGATIVE   Ketones, ur 15 (A) NEGATIVE mg/dL   Protein, ur >=161 (A) NEGATIVE mg/dL   Nitrite POSITIVE (A) NEGATIVE   Leukocytes,Ua SMALL (A) NEGATIVE  Urinalysis, Microscopic (reflex)     Status: Abnormal   Collection Time: 09/22/23  3:50 PM  Result Value Ref Range   RBC / HPF >50 0 - 5 RBC/hpf   WBC, UA >50 0 - 5 WBC/hpf   Bacteria, UA MANY (A) NONE SEEN   Squamous Epithelial / HPF 0-5 0 - 5 /HPF   Urine-Other LESS THAN 10 mL OF URINE SUBMITTED   CBC with Differential     Status: Abnormal   Collection Time: 09/22/23  3:53 PM  Result Value Ref Range   WBC 15.5 (H) 4.0 - 10.5 K/uL   RBC 4.32 4.22 - 5.81 MIL/uL   Hemoglobin 11.3 (L) 13.0 - 17.0 g/dL   HCT 09.6 (L) 04.5 - 40.9 %   MCV 78.0 (L) 80.0 - 100.0 fL   MCH 26.2 26.0 - 34.0 pg   MCHC  33.5 30.0 - 36.0 g/dL   RDW 81.1 91.4 - 78.2 %   Platelets 503 (H) 150 - 400 K/uL   nRBC 0.0 0.0 - 0.2 %   Neutrophils Relative % 83 %   Neutro Abs 13.0 (H) 1.7 - 7.7 K/uL   Lymphocytes Relative 10 %   Lymphs Abs 1.5 0.7 - 4.0 K/uL   Monocytes Relative 6 %   Monocytes Absolute 0.9 0.1 - 1.0 K/uL   Eosinophils Relative 0 %   Eosinophils Absolute 0.0 0.0 - 0.5 K/uL   Basophils Relative 0 %   Basophils Absolute 0.0 0.0 - 0.1 K/uL   Immature Granulocytes 1 %   Abs Immature Granulocytes 0.07 0.00 - 0.07 K/uL  Comprehensive metabolic panel     Status: Abnormal   Collection Time: 09/22/23  3:53 PM  Result Value Ref Range   Sodium 133 (L) 135 - 145 mmol/L   Potassium 4.4 3.5 - 5.1 mmol/L   Chloride 102 98 - 111 mmol/L   CO2 21 (L) 22 - 32 mmol/L   Glucose, Bld 181 (H) 70 - 99 mg/dL   BUN 23 8 - 23 mg/dL   Creatinine, Ser 9.56 (H) 0.61 - 1.24 mg/dL   Calcium 8.9 8.9 - 21.3 mg/dL   Total  Protein 8.0 6.5 - 8.1 g/dL   Albumin 3.0 (L) 3.5 - 5.0 g/dL   AST 20 15 - 41 U/L   ALT 29 0 - 44 U/L   Alkaline Phosphatase 72 38 - 126 U/L   Total Bilirubin 0.9 0.0 - 1.2 mg/dL   GFR, Estimated 34 (L) >60 mL/min   Anion gap 10 5 - 15   Basic Metabolic Panel: Recent Labs  Lab 09/22/23 1553  NA 133*  K 4.4  CL 102  CO2 21*  GLUCOSE 181*  BUN 23  CREATININE 2.03*  CALCIUM 8.9   Liver Function Tests: Recent Labs  Lab 09/22/23 1553  AST 20  ALT 29  ALKPHOS 72  BILITOT 0.9  PROT 8.0  ALBUMIN 3.0*   No results for input(s): "LIPASE", "AMYLASE" in the last 168 hours. No results for input(s): "AMMONIA" in the last 168 hours. CBC: Recent Labs  Lab 09/22/23 1553  WBC 15.5*  NEUTROABS 13.0*  HGB 11.3*  HCT 33.7*  MCV 78.0*  PLT 503*   Cardiac Enzymes: No results for input(s): "CKTOTAL", "CKMB", "CKMBINDEX", "TROPONINIHS" in the last 168 hours.  BNP (last 3 results) No results for input(s): "PROBNP" in the last 8760 hours. CBG: No results for input(s): "GLUCAP" in the last 168  hours.  Radiological Exams on Admission:  No results found. ____________________________________________________________________________

## 2023-09-22 NOTE — ED Notes (Signed)
Called lab, will add on urine culture

## 2023-09-22 NOTE — ED Triage Notes (Signed)
Pt reports having partial prostatectomy on 08/25/23, started having intermittent frank red blood and blood clots in the urine on Sat morning, pt takes Plavix & aspirin

## 2023-09-22 NOTE — ED Provider Notes (Signed)
[place] [facility] Provider Note   CSN: [id number] Arrival date & time: 09/22/23  1318     History  Chief Complaint  Patient presents with  . Hematuria    Christian Mclaughlin is a 77 y.o. male history of enlarged prostate, diabetes, hypertension, here presenting with hematuria.  Patient had a TURP done by Dr. Cardell Peach on December 2.  Patient had a three-way catheter at that time.  Patient was discharged home and had some hematuria.  Patient then follow-up with urology about a week later and had the catheter removed.  Patient states that for the last 3 to 4 days he noticed worsening hematuria.  Patient states that he noticed large clots that comes out.  He states that he had a hard time urinating this morning.  Patient is on aspirin and Plavix.  He is not on other blood thinners  The history is provided by the patient.       Home Medications Prior to Admission medications   Medication Sig Start Date End Date Taking? Authorizing Provider  amLODipine (NORVASC) 10 MG tablet Take 10 mg by mouth daily. 04/25/18   [provider]  aspirin EC 81 MG tablet Take 81 mg by mouth daily. 02/26/23 01/03/24  [provider]  atorvastatin (LIPITOR) 40 MG tablet Take 1 tablet (40 mg total) by mouth daily at 6 PM. Patient taking differently: Take 40 mg by mouth at bedtime. 06/02/18   Standley Brooking, MD  Calcium Polycarbophil (FIBER) 625 MG TABS Take 2 tablets by mouth daily. 06/18/22   [provider]  carbamide peroxide (DEBROX) 6.5 % OTIC solution Place 5-10 drops into both ears 2 (two) times daily as needed.    [provider]  clopidogrel (PLAVIX) 75 MG tablet Take 1 tablet (75 mg total) by mouth daily. 06/03/18   Standley Brooking, MD  cyclobenzaprine (FLEXERIL) 10 MG tablet Take 10 mg by mouth 2 (two) times daily as needed for muscle spasms. Evening and HS only    [provider]  docusate sodium (COLACE) 100 MG capsule  Take 1 capsule (100 mg total) by mouth daily as needed for up to 30 doses. 08/25/23   Jannifer Hick, MD  ferrous sulfate 325 (65 FE) MG EC tablet Take 325 mg by mouth daily. 06/06/23   [provider]  finasteride (PROSCAR) 5 MG tablet Take 5 mg by mouth daily.    [provider]  insulin glargine-yfgn (SEMGLEE, YFGN,) 100 UNIT/ML Pen Inject 17 Units into the skin at bedtime. [facility] substitute for Lantus    [provider]  lisinopril (ZESTRIL) 40 MG tablet Take 40 mg by mouth daily. 01/02/23   [provider]  metoprolol succinate (TOPROL-XL) 50 MG 24 hr tablet Take 25 mg by mouth daily. Takes half tablet  daily 06/06/23   [provider]  miconazole (MICOTIN) 2 % powder Apply topically 2 (two) times daily as needed for itching (goin rash).    [provider]  oxyCODONE-acetaminophen (PERCOCET) 5-325 MG tablet Take 1 tablet by mouth every 4 (four) hours as needed for up to 12 doses for severe pain (pain score 7-10). 08/25/23   Jannifer Hick, MD  PRESCRIPTION MEDICATION Take 4 sprays by mouth 4 (four) times daily as needed. ARTIFICIAL SALIVA ORAL SPRAY  (GETS FROM [facility])    [provider]  Semaglutide, 1 MG/DOSE, 4 MG/3ML SOPN Inject 1 mg into the skin once a week. Wednesday's  (  ozempic) 05/15/23   [provider]  terazosin (HYTRIN) 5 MG capsule Take 5 mg by mouth at bedtime. 02/27/23 02/28/24  [provider]      Allergies    Patient has no known allergies.    Review of Systems   Review of Systems  Genitourinary:  Positive for hematuria.  All other systems reviewed and are negative.   Physical Exam Updated Vital Signs BP (!) 140/76 (BP Location: Left Arm)   Pulse 88   Temp 98.7 F (37.1 C) (Oral)   Resp 16   Ht 5\' 9"  (1.753 m)   Wt 89.4 kg   SpO2 99%   BMI 29.09 kg/m  Physical Exam Vitals and nursing note reviewed.  Constitutional:      Appearance: Normal appearance.  HENT:     Head: Normocephalic.      Nose: Nose normal.     Mouth/Throat:     Mouth: Mucous membranes are moist.  Eyes:     Extraocular Movements: Extraocular movements intact.     Pupils: Pupils are equal, round, and reactive to light.  Cardiovascular:     Rate and Rhythm: Normal rate and regular rhythm.     Pulses: Normal pulses.     Heart sounds: Normal heart sounds.  Pulmonary:     Effort: Pulmonary effort is normal.     Breath sounds: Normal breath sounds.  Abdominal:     General: Abdomen is flat.     Palpations: Abdomen is soft.  Musculoskeletal:        General: Normal range of motion.     Cervical back: Normal range of motion and neck supple.  Skin:    General: Skin is warm.     Capillary Refill: Capillary refill takes less than 2 seconds.  Neurological:     General: No focal deficit present.     Mental Status: He is alert and oriented to person, place, and time.  Psychiatric:        Mood and Affect: Mood normal.        Behavior: Behavior normal.    ED Results / Procedures / Treatments   Labs (all labs ordered are listed, but only abnormal results are displayed) Labs Reviewed  CBC WITH DIFFERENTIAL/PLATELET - Abnormal; Notable for the following components:      Result Value   WBC 15.5 (*)    Hemoglobin 11.3 (*)    HCT 33.7 (*)    MCV 78.0 (*)    Platelets 503 (*)    Neutro Abs 13.0 (*)    All other components within normal limits  COMPREHENSIVE METABOLIC PANEL - Abnormal; Notable for the following components:   Sodium 133 (*)    CO2 21 (*)    Glucose, Bld 181 (*)    Creatinine, Ser 2.03 (*)    Albumin 3.0 (*)    GFR, Estimated 34 (*)    All other components within normal limits  URINALYSIS, ROUTINE W REFLEX MICROSCOPIC    EKG None  Radiology No results found.  Procedures BLADDER CATHETERIZATION  Date/Time: 09/22/2023 4:31 PM  Performed by: Charlynne Pander, MD Authorized by: Charlynne Pander, MD   Consent:    Consent obtained:  Verbal   Consent given by:  Patient    Risks, benefits, and alternatives were discussed: yes     Risks discussed:  False passage and urethral injury   Alternatives discussed:  No treatment Universal protocol:    Procedure explained and questions answered to patient or proxy's  satisfaction: yes     Patient identity confirmed:  Verbally with patient Pre-procedure details:    Procedure purpose:  Diagnostic   Preparation: Patient was prepped and draped in usual sterile fashion   Anesthesia:    Anesthesia method:  Topical application   Topical anesthetic:  Lidocaine gel Procedure details:    Provider performed due to:  Complicated insertion   Catheter insertion:  Indwelling   Catheter size:  24 Fr   Bladder irrigation: yes     Number of attempts:  1   Urine characteristics:  Bloody Post-procedure details:    Procedure completion:  Tolerated Comments:     24 F three-way catheter placed       Medications Ordered in ED Medications  sodium chloride 0.9 % bolus 1,000 mL (has no administration in time range)  lidocaine (XYLOCAINE) 2 % jelly 1 Application (has no administration in time range)    ED Course/ Medical Decision Making/ A&P                                 Medical Decision Making Christian Mclaughlin is a 77 y.o. male here presenting with hematuria.  Patient just had a TURP about a month ago.  I have performed a bedside ultrasound and there is a large clot in the bladder.  Patient's bladder volume is around 200.  Patient had a three-way catheter placed after TURP.  Will place another three-way catheter and will irrigate the catheter  5:30 PM I reviewed patient's labs and white blood cell count is 15.  Patient's UA showed UTI.  Patient also has an AKI.  I initially irrigated the catheter with 1000 cc of normal saline and the hematuria resolved.  I just reassessed the patient and patient's hematuria returned.  I was able to manually irrigate the catheter again and the hematuria clears and comes back again.  I discussed  case with Dr. Annabell Howells from urology.  He recommend admission to hospitalist service and urology will consult.  He does not want to start CBI currently but wants manual irrigation every 4 hours.  I have put in orders for manual irrigation.  At this point hospitalist to admit.  Problems Addressed: AKI (acute kidney injury) (HCC): acute illness or injury Gross hematuria: acute illness or injury Urinary tract infection with hematuria, site unspecified: acute illness or injury  Amount and/or Complexity of Data Reviewed Labs: ordered. Decision-making details documented in ED Course.  Risk Prescription drug management. Decision regarding hospitalization.    Final Clinical Impression(s) / ED Diagnoses Final diagnoses:  None    Rx / DC Orders ED Discharge Orders     None         Charlynne Pander, MD 09/22/23 [phone]

## 2023-09-22 NOTE — H&P (Signed)
History and Physical    Christian Mclaughlin KGM:[id number] DOB: [birth year] DOA: 09/22/2023  PCP: Wood-Cummings, Gifford Shave, MD  Patient coming from: Home  I have personally briefly reviewed patient's old medical records in [facility]  Chief Complaint: Blood in urine  HPI: Christian Mclaughlin is a 77 y.o. male with medical history significant of type 2 diabetes mellitus, hypertension, hypertension, BPH status post TURP on December 2 who presented to ED with a complaint of hematuria.  Patient had TURP on December 2 by Dr. Cardell Peach, he had three-way catheter at that time, patient was discharged home and had some hematuria, he followed up with urology a week later, catheter was removed.  He was doing well up until 3 to 4 days ago when he started having hematuria again.  He has noticed large clots.  He was having hard time urinating this morning.  Patient is on aspirin and Plavix but not on any other blood thinners, patient unsure why he is on DAPT.  ED Course: Upon arrival to ED, he was slightly hypertensive.  Patient was noted to have obstructive uropathy, Foley catheter was placed in the ED.  EDP discussed the case with Dr. Annabell Howells of urology who recommended admission under hospitalist service but did not want to start CBI and wanted manual irrigation every 4 hours.  Hospital service was then consulted for admission.  Review of Systems: As per HPI otherwise negative.    Past Medical History:  Diagnosis Date   BPH with obstruction/lower urinary tract symptoms    CKD (chronic kidney disease), stage III (HCC)    DDD (degenerative disc disease), cervical    ED (erectile dysfunction)    History of CVA (cerebrovascular accident) without residual deficits 05/31/2018   followed by pcp at [facility];   admission in epic;   left MCA pontine paramedian infarct along with remote punctate lacunar infart left inferanl capsule secondary to small vessel disease;  no residual   Hyperlipidemia    Hypertension    Malignant  neoplasm prostate [facility]) 04/2023   urologist-- dr gay/  radiation oncologist--- dr Kathrynn Running;  dx 08/ 2024, gleason 3+4,  psa 5,  vol  113 cc   OSA on CPAP    [facility]   ([facility]  pt stated uses 5 out 7 days per week)   Type 2 diabetes mellitus treated with insulin (HCC)    [facility];    (08-14-2023  pt stated check blood sugar multiple times daily w/ Josephine Igo 3,  fasting average 100-140)   Urge urinary incontinence    Wears glasses    Wears partial dentures    upper and lower    Past Surgical History:  Procedure Laterality Date   COLONOSCOPY  2015   approx   GOLD SEED IMPLANT N/A 08/25/2023   Procedure: GOLD SEED IMPLANT;  Surgeon: Jannifer Hick, MD;  Location: [facility];  Service: Urology;  Laterality: N/A;   SOFT TISSUE MASS EXCISION Left    1970s;   left breast area,  pt stated benign   SPACE OAR INSTILLATION N/A 08/25/2023   Procedure: SPACE OAR INSTILLATION;  Surgeon: Jannifer Hick, MD;  Location: [facility];  Service: Urology;  Laterality: N/A;   TRANSURETHRAL RESECTION OF PROSTATE N/A 08/25/2023   Procedure: BIPOLAR TRANSURETHRAL RESECTION OF THE PROSTATE (TURP);  Surgeon: Jannifer Hick, MD;  Location: [facility];  Service: Urology;  Laterality: N/A;  90 MINUTES NEEDED FOR CASE  reports that he quit smoking about 13 years ago. His smoking use included cigarettes. He started smoking about 57 years ago. He has never used smokeless tobacco. He reports current alcohol use. He reports that he does not use drugs.  No Known Allergies  History reviewed. No pertinent family history.  Prior to Admission medications   Medication Sig Start Date End Date Taking? Authorizing Provider  amLODipine (NORVASC) 10 MG tablet Take 10 mg by mouth daily. 04/25/18   [provider]  aspirin EC 81 MG tablet Take 81 mg by mouth daily. 02/26/23 01/03/24  [provider]  atorvastatin (LIPITOR) 40 MG tablet Take 1 tablet (40  mg total) by mouth daily at 6 PM. Patient taking differently: Take 40 mg by mouth at bedtime. 06/02/18   Standley Brooking, MD  Calcium Polycarbophil (FIBER) 625 MG TABS Take 2 tablets by mouth daily. 06/18/22   [provider]  carbamide peroxide (DEBROX) 6.5 % OTIC solution Place 5-10 drops into both ears 2 (two) times daily as needed.    [provider]  clopidogrel (PLAVIX) 75 MG tablet Take 1 tablet (75 mg total) by mouth daily. 06/03/18   Standley Brooking, MD  cyclobenzaprine (FLEXERIL) 10 MG tablet Take 10 mg by mouth 2 (two) times daily as needed for muscle spasms. Evening and HS only    [provider]  docusate sodium (COLACE) 100 MG capsule Take 1 capsule (100 mg total) by mouth daily as needed for up to 30 doses. 08/25/23   Jannifer Hick, MD  ferrous sulfate 325 (65 FE) MG EC tablet Take 325 mg by mouth daily. 06/06/23   [provider]  finasteride (PROSCAR) 5 MG tablet Take 5 mg by mouth daily.    [provider]  insulin glargine-yfgn (SEMGLEE, YFGN,) 100 UNIT/ML Pen Inject 17 Units into the skin at bedtime. [facility] substitute for Lantus    [provider]  lisinopril (ZESTRIL) 40 MG tablet Take 40 mg by mouth daily. 01/02/23   [provider]  metoprolol succinate (TOPROL-XL) 50 MG 24 hr tablet Take 25 mg by mouth daily. Takes half tablet  daily 06/06/23   [provider]  miconazole (MICOTIN) 2 % powder Apply topically 2 (two) times daily as needed for itching (goin rash).    [provider]  oxyCODONE-acetaminophen (PERCOCET) 5-325 MG tablet Take 1 tablet by mouth every 4 (four) hours as needed for up to 12 doses for severe pain (pain score 7-10). 08/25/23   Jannifer Hick, MD  PRESCRIPTION MEDICATION Take 4 sprays by mouth 4 (four) times daily as needed. ARTIFICIAL SALIVA ORAL SPRAY  (GETS [facility])    [provider]  Semaglutide, 1 MG/DOSE, 4 MG/3ML SOPN Inject 1 mg into the skin once a week.  Wednesday's  (ozempic) 05/15/23   [provider]  terazosin (HYTRIN) 5 MG capsule Take 5 mg by mouth at bedtime. 02/27/23 02/28/24  [provider]    Physical Exam: Vitals:   09/22/23 1411 09/22/23 1645 09/22/23 1830 09/22/23 2223  BP: (!) 140/76 (!) 152/81 (!) 153/99 (!) 159/78  Pulse: 88 84 89 93  Resp: 16 17 18 20   Temp: 98.7 F (37.1 C)  98.7 F (37.1 C) 99.1 F (37.3 C)  TempSrc: Oral  Oral   SpO2: 99% 100% 100% 100%  Weight:      Height:        Constitutional: NAD, calm, comfortable Vitals:   09/22/23 1411 09/22/23 1645 09/22/23  1830 09/22/23 2223  BP: (!) 140/76 (!) 152/81 (!) 153/99 (!) 159/78  Pulse: 88 84 89 93  Resp: 16 17 18 20   Temp: 98.7 F (37.1 C)  98.7 F (37.1 C) 99.1 F (37.3 C)  TempSrc: Oral  Oral   SpO2: 99% 100% 100% 100%  Weight:      Height:       Eyes: PERRL, lids and conjunctivae normal ENMT: Mucous membranes are moist. Posterior pharynx clear of any exudate or lesions.Normal dentition.  Neck: normal, supple, no masses, no thyromegaly Respiratory: clear to auscultation bilaterally, no wheezing, no crackles. Normal respiratory effort. No accessory muscle use.  Cardiovascular: Regular rate and rhythm, no murmurs / rubs / gallops. No extremity edema. 2+ pedal pulses. No carotid bruits.  Abdomen: no tenderness, no masses palpated. No hepatosplenomegaly. Bowel sounds positive.  Musculoskeletal: no clubbing / cyanosis. No joint deformity upper and lower extremities. Good ROM, no contractures. Normal muscle tone.  Skin: no rashes, lesions, ulcers. No induration Neurologic: CN 2-12 grossly intact. Sensation intact, DTR normal. Strength 5/5 in all 4.  Psychiatric: Normal judgment and insight. Alert and oriented x 3. Normal mood.    Labs on Admission: I have personally reviewed following labs and imaging studies  CBC: Recent Labs  Lab 09/22/23 1553  WBC 15.5*  NEUTROABS 13.0*  HGB 11.3*  HCT 33.7*  MCV 78.0*  PLT 503*   Basic  Metabolic Panel: Recent Labs  Lab 09/22/23 1553  NA 133*  K 4.4  CL 102  CO2 21*  GLUCOSE 181*  BUN 23  CREATININE 2.03*  CALCIUM 8.9   GFR: Estimated Creatinine Clearance: 34.8 mL/min (A) (by C-G formula based on SCr of 2.03 mg/dL (H)). Liver Function Tests: Recent Labs  Lab 09/22/23 1553  AST 20  ALT 29  ALKPHOS 72  BILITOT 0.9  PROT 8.0  ALBUMIN 3.0*   No results for input(s): "LIPASE", "AMYLASE" in the last 168 hours. No results for input(s): "AMMONIA" in the last 168 hours. Coagulation Profile: No results for input(s): "INR", "PROTIME" in the last 168 hours. Cardiac Enzymes: No results for input(s): "CKTOTAL", "CKMB", "CKMBINDEX", "TROPONINI" in the last 168 hours. BNP (last 3 results) No results for input(s): "PROBNP" in the last 8760 hours. HbA1C: No results for input(s): "HGBA1C" in the last 72 hours. CBG: No results for input(s): "GLUCAP" in the last 168 hours. Lipid Profile: No results for input(s): "CHOL", "HDL", "LDLCALC", "TRIG", "CHOLHDL", "LDLDIRECT" in the last 72 hours. Thyroid Function Tests: No results for input(s): "TSH", "T4TOTAL", "FREET4", "T3FREE", "THYROIDAB" in the last 72 hours. Anemia Panel: No results for input(s): "VITAMINB12", "FOLATE", "FERRITIN", "TIBC", "IRON", "RETICCTPCT" in the last 72 hours. Urine analysis:    Component Value Date/Time   COLORURINE RED (A) 09/22/2023 1550   APPEARANCEUR CLOUDY (A) 09/22/2023 1550   LABSPEC 1.025 09/22/2023 1550   PHURINE 5.5 09/22/2023 1550   GLUCOSEU NEGATIVE 09/22/2023 1550   HGBUR LARGE (A) 09/22/2023 1550   BILIRUBINUR SMALL (A) 09/22/2023 1550   KETONESUR 15 (A) 09/22/2023 1550   PROTEINUR >=300 (A) 09/22/2023 1550   NITRITE POSITIVE (A) 09/22/2023 1550   LEUKOCYTESUR SMALL (A) 09/22/2023 1550    Radiological Exams on Admission: No results found.  Assessment/Plan Principal Problem:   Hematuria   Mild acute blood loss anemia secondary to hematuria status post recent TURP due  to BPH: Continue manual irrigation every 4 hours per urology recommendations.  Hemoglobin will repeat CBC in the morning.  Urology to evaluate in the morning.  UTI: UA consistent with UTI.  Patient received Rocephin in the ED which I will continue and follow culture.  AKI on CKD stage IIIa: Patient's baseline creatinine appears to be around 1.3-1.4 but currently 2.03.  Likely due to obstructive uropathy.  Now that he has Foley catheter, I will also start him on normal saline at 150 cc/h for 20 hours.  Repeat labs in the morning.  Mild hyponatremia: Hopefully this should resolve with IV fluids.  Repeat labs in the morning.  Type 2 diabetes mellitus: Resume Semglee 17 units and placed on SSI.  Essential hypertension: Blood pressure slightly elevated.  Resume amlodipine metoprolol and finasteride but hold lisinopril due to AKI.  Hyperlipidemia: Resume statin.  DVT prophylaxis: SCDs Start: 09/22/23 2241 Code Status: Full code Family Communication: None present at bedside.  Plan of care discussed with patient in length and he verbalized understanding and agreed with it. Disposition Plan: Per urology Consults called: Urology  Hughie Closs MD [facility]  *Please note that this is a verbal dictation therefore any spelling or grammatical errors are due to the "Dragon Medical One" system interpretation.  Please page via Amion and do not message via secure chat for urgent patient care matters. Secure chat can be used for non urgent patient care matters. 09/22/2023, 10:42 PM  To contact the attending provider between 7A-7P or the covering provider during after hours 7P-7A, please log into the web site [email or web address]

## 2023-09-22 NOTE — ED Notes (Signed)
Flushed catheter with before transport. Foley is draining

## 2023-09-22 NOTE — ED Notes (Signed)
[facility] called for transport, No ETA ED Nurse will call floor to give report Called @ 20:43

## 2023-09-23 DIAGNOSIS — R310 Gross hematuria: Secondary | ICD-10-CM | POA: Diagnosis not present

## 2023-09-23 LAB — GLUCOSE, CAPILLARY
Glucose-Capillary: 126 mg/dL — ABNORMAL HIGH (ref 70–99)
Glucose-Capillary: 152 mg/dL — ABNORMAL HIGH (ref 70–99)
Glucose-Capillary: 207 mg/dL — ABNORMAL HIGH (ref 70–99)
Glucose-Capillary: 112 mg/dL — ABNORMAL HIGH (ref 70–99)

## 2023-09-23 LAB — BASIC METABOLIC PANEL
Anion gap: 8 (ref 5–15)
BUN: 21 mg/dL (ref 8–23)
CO2: 18 mmol/L — ABNORMAL LOW (ref 22–32)
Calcium: 7.9 mg/dL — ABNORMAL LOW (ref 8.9–10.3)
Chloride: 105 mmol/L (ref 98–111)
Creatinine, Ser: 1.95 mg/dL — ABNORMAL HIGH (ref 0.61–1.24)
GFR, Estimated: 35 mL/min — ABNORMAL LOW (ref >60)
Glucose, Bld: 207 mg/dL — ABNORMAL HIGH (ref 70–99)
Potassium: 4.2 mmol/L (ref 3.5–5.1)
Sodium: 131 mmol/L — ABNORMAL LOW (ref 135–145)

## 2023-09-23 LAB — CBC
HCT: 33 % — ABNORMAL LOW (ref 39.0–52.0)
Hemoglobin: 10.4 g/dL — ABNORMAL LOW (ref 13.0–17.0)
MCH: 26 pg (ref 26.0–34.0)
MCHC: 31.5 g/dL (ref 30.0–36.0)
MCV: 82.5 fL (ref 80.0–100.0)
Platelets: 454 10*3/uL — ABNORMAL HIGH (ref 150–400)
RBC: 4 MIL/uL — ABNORMAL LOW (ref 4.22–5.81)
RDW: 13.7 % (ref 11.5–15.5)
WBC: 13.3 10*3/uL — ABNORMAL HIGH (ref 4.0–10.5)
nRBC: 0 % (ref 0.0–0.2)

## 2023-09-23 LAB — HEMOGLOBIN A1C
Hgb A1c MFr Bld: 8.3 % — ABNORMAL HIGH (ref 4.8–5.6)
Mean Plasma Glucose: 192 mg/dL

## 2023-09-23 MED ORDER — SODIUM CHLORIDE 0.9 % IR SOLN
3000.0000 mL | Status: DC
  Administered 2023-09-23: 3000 mL

## 2023-09-23 MED ORDER — ENSURE ENLIVE PO LIQD
237.0000 mL | Freq: Two times a day (BID) | ORAL | Status: DC
  Administered 2023-09-23 – 2023-09-25 (×4): 237 mL via ORAL

## 2023-09-23 MED ORDER — DOCUSATE SODIUM 100 MG PO CAPS
200.0000 mg | ORAL_CAPSULE | Freq: Two times a day (BID) | ORAL | Status: DC
  Administered 2023-09-23 – 2023-09-24 (×3): 200 mg via ORAL
  Filled 2023-09-23 (×3): qty 2

## 2023-09-23 MED ORDER — CHLORHEXIDINE GLUCONATE CLOTH 2 % EX PADS
6.0000 | MEDICATED_PAD | Freq: Every day | CUTANEOUS | Status: DC
  Administered 2023-09-23 – 2023-09-24 (×2): 6 via TOPICAL

## 2023-09-23 NOTE — Consult Note (Signed)
 Urology Consult Note   Requesting Attending Physician:  Christian Yetta HERO, Christian Mclaughlin Service Providing Consult: Urology  Consulting Attending: Dr. Carolee   Reason for Consult:  hematuria  HPI: Christian Mclaughlin is seen in consultation for reasons noted above at the request of Christian Yetta HERO, Christian Mclaughlin. patient is known to our practice and is followed by Christian. [place] for prostate cancer, having recently undergone TURP on 09/01/2023.  He reports that yesterday he began developing hematuria, eventually progressing to large clot passage.  He did not become obstructed at any point but this prompted him to present to the emergency department.  At the ED, a three-way hematuria catheter was placed and patient was hand irrigated.  Unfortunately, his hematuria returned quickly afterwards and decision was made to transfer patient to [facility] in the event that he may need clot evacuation and fulguration.  On my arrival patient had light to medium rust colored urine without any clot material.  He was alert, oriented, and in no distress.  Case and plan was discussed and all questions were answered to his satisfaction.  ------------------  Assessment:  77 y.o. male with hematuria   Recommendations: #s/p TURP #Hematuria  Transfer to urology floor. Initiate CBI, titrate to light pink irrigant Hand irrigate as needed Trend hemoglobin and transfuse if < 7.0 Appreciate hospitalist assistance.  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 05/31/2018   followed by pcp at [facility];   admission in epic;   left MCA pontine paramedian infarct along with remote punctate lacunar infart left inferanl capsule secondary to  small vessel disease;  no residual   Hyperlipidemia    Hypertension    Malignant neoplasm prostate [facility]) 04/2023   urologist-- Christian Mclaughlin/  radiation oncologist--- Christian Mclaughlin;  dx 08/ 2024, gleason 3+4,  psa 5,  vol  113 cc   OSA on CPAP    [facility]   ([facility]  pt stated uses 5 out 7 days per week)   Type 2 diabetes mellitus treated with insulin  (HCC)    [facility];    (08-14-2023  pt stated check blood sugar multiple times daily w/ Herlene 3,  fasting average 100-140)   Urge urinary incontinence    Wears glasses    Wears partial dentures    upper and lower    Past Surgical History:  Past Surgical History:  Procedure Laterality Date   COLONOSCOPY  2015   approx   GOLD SEED IMPLANT N/A 08/25/2023   Procedure: GOLD SEED IMPLANT;  Surgeon: Christian Donnice SAUNDERS, Christian Mclaughlin;  Location: [facility];  Service: Urology;  Laterality: N/A;   SOFT TISSUE MASS EXCISION Left    1970s;   left breast area,  pt stated benign   SPACE OAR INSTILLATION N/A 08/25/2023   Procedure: SPACE OAR INSTILLATION;  Surgeon: Christian Donnice SAUNDERS, Christian Mclaughlin;  Location: [facility];  Service: Urology;  Laterality: N/A;   TRANSURETHRAL RESECTION OF PROSTATE N/A 08/25/2023   Procedure: BIPOLAR TRANSURETHRAL RESECTION OF THE PROSTATE (TURP);  Surgeon: Christian Donnice SAUNDERS, Christian Mclaughlin;  Location: [facility];  Service: Urology;  Laterality: N/A;  90 MINUTES NEEDED FOR CASE  Medication: Current Facility-Administered Medications  Medication Dose Route Frequency Provider Last Rate Last Admin   0.9 %  sodium chloride  infusion   Intravenous Continuous Patel, Christian Mclaughlin, Christian Mclaughlin 125 mL/hr at 09/23/23 1434 New Bag at 09/23/23 1434   acetaminophen  (TYLENOL ) tablet 650 mg  650 mg Oral Q6H PRN Christian Ranks, Christian Mclaughlin       Or   acetaminophen  (TYLENOL ) suppository 650 mg  650 mg Rectal Q6H PRN Christian Ranks, Christian Mclaughlin       amLODipine  (NORVASC ) tablet 10 mg  10 mg Oral Daily Mclaughlin, Ravi, Christian Mclaughlin   10 mg at 09/23/23 0810   atorvastatin   (LIPITOR) tablet 40 mg  40 mg Oral q1800 Christian Ranks, Christian Mclaughlin       cefTRIAXone  (ROCEPHIN ) 1 g in sodium chloride  0.9 % 100 mL IVPB  1 g Intravenous Q24H Christian Ranks, Christian Mclaughlin       Chlorhexidine  Gluconate Cloth 2 % PADS 6 each  6 each Topical Daily Patel, Christian Mclaughlin, Christian Mclaughlin   6 each at 09/23/23 1214   cyclobenzaprine  (FLEXERIL ) tablet 10 mg  10 mg Oral BID PRN Mclaughlin, Ravi, Christian Mclaughlin       docusate sodium  (COLACE) capsule 200 mg  200 mg Oral BID Patel, Christian Mclaughlin, Christian Mclaughlin       feeding supplement (ENSURE ENLIVE / ENSURE PLUS) liquid 237 mL  237 mL Oral BID BM Patel, Christian Mclaughlin, Christian Mclaughlin   237 mL at 09/23/23 1434   ferrous sulfate  tablet 325 mg  325 mg Oral Q breakfast Christian Ranks, Christian Mclaughlin   325 mg at 09/23/23 0810   finasteride  (PROSCAR ) tablet 5 mg  5 mg Oral Daily Mclaughlin, Ravi, Christian Mclaughlin   5 mg at 09/23/23 9189   insulin  aspart (novoLOG ) injection 0-15 Units  0-15 Units Subcutaneous TID WC Mclaughlin, Ravi, Christian Mclaughlin   3 Units at 09/23/23 1213   insulin  aspart (novoLOG ) injection 0-5 Units  0-5 Units Subcutaneous QHS Christian Ranks, Christian Mclaughlin       insulin  glargine-yfgn (SEMGLEE ) injection 17 Units  17 Units Subcutaneous QHS Mclaughlin, Ravi, Christian Mclaughlin   17 Units at 09/23/23 0103   metoprolol  succinate (TOPROL -XL) 24 hr tablet 25 mg  25 mg Oral Daily Mclaughlin, Ravi, Christian Mclaughlin   25 mg at 09/23/23 9189   ondansetron  (ZOFRAN ) tablet 4 mg  4 mg Oral Q6H PRN Mclaughlin, Ravi, Christian Mclaughlin       Or   ondansetron  (ZOFRAN ) injection 4 mg  4 mg Intravenous Q6H PRN Mclaughlin, Ravi, Christian Mclaughlin       oxyCODONE -acetaminophen  (PERCOCET/ROXICET) 5-325 MG per tablet 1 tablet  1 tablet Oral Q4H PRN Mclaughlin, Ravi, Christian Mclaughlin       sodium chloride  flush (NS) 0.9 % injection 3 mL  3 mL Intravenous Q12H Christian Ranks, Christian Mclaughlin   3 mL at 09/23/23 0015   terazosin  (HYTRIN ) capsule 5 mg  5 mg Oral QHS Mclaughlin, Ravi, Christian Mclaughlin   5 mg at 09/23/23 0015    Allergies: No Known Allergies  Social History: Social History   Tobacco Use   Smoking status: Former    Current packs/day: 0.00    Types: Cigarettes    Start date: 1968    Quit  date: 2012    Years since quitting: 13.0   Smokeless tobacco: Never   Tobacco comments:    08-14-2023  quit smoking approx 2012,  started age 35  (61)  Vaping Use   Vaping status: Never Used  Substance Use Topics   Alcohol use: Yes    Comment: occasional   Drug  use: Never    Family History History reviewed. No pertinent family history.  Review of Systems  Genitourinary:  Positive for hematuria.     Objective   Vital signs in last 24 hours: BP 137/75 (BP Location: Left Arm)   Pulse 86   Temp 100 F (37.8 C)   Resp 18   Ht 5' 9 (1.753 Mclaughlin)   Wt 84.5 kg   SpO2 99%   BMI 27.51 kg/Mclaughlin   Physical Exam General: NAD, A&O, resting, appropriate HEENT: [place]/AT Pulmonary: Normal work of breathing Cardiovascular: no cyanosis Abdomen: Soft, NTTP, nondistended GU: Three-way hematuria catheter in place with plugged CBI port.  Light to medium rust colored urine with no clot material. Neuro: Appropriate, no focal neurological deficits  Most Recent Labs: Lab Results  Component Value Date   WBC 13.3 (H) 09/23/2023   HGB 10.4 (L) 09/23/2023   HCT 33.0 (L) 09/23/2023   PLT 454 (H) 09/23/2023    Lab Results  Component Value Date   NA 131 (L) 09/23/2023   K 4.2 09/23/2023   CL 105 09/23/2023   CO2 18 (L) 09/23/2023   BUN 21 09/23/2023   CREATININE 1.95 (H) 09/23/2023   CALCIUM  7.9 (L) 09/23/2023    Lab Results  Component Value Date   INR 1.05 05/31/2018   APTT 28 05/31/2018     Urine Culture: @[id number] (laburin,org,[id number],[id number])@   IMAGING: No results found.  ------  Ole Bourdon, NP Pager: [phone]   Please contact the urology consult pager with any further questions/concerns.

## 2023-09-23 NOTE — Progress Notes (Signed)
 Mobility Specialist - Progress Note   09/23/23 1519  Mobility  Activity Ambulated independently in hallway  Level of Assistance Independent  Assistive Device None  Distance Ambulated (ft) 500 ft  Activity Response Tolerated well  Mobility Referral Yes  Mobility visit 1 Mobility  Mobility Specialist Start Time (ACUTE ONLY) 1507  Mobility Specialist Stop Time (ACUTE ONLY) 1518  Mobility Specialist Time Calculation (min) (ACUTE ONLY) 11 min   Pt received in bed and agreeable to mobility. No complaints during session. Pt to bed after session with all needs met.      [facility]

## 2023-09-23 NOTE — Plan of Care (Signed)

## 2023-09-23 NOTE — Progress Notes (Signed)
 [facility] Progress Note Patient: Christian Mclaughlin FMW:[id number] DOB: 29-Jan-1946 DOA: 09/22/2023  DOS: the patient was seen and examined on 09/23/2023  Brief Hospital Course: PMH of type II DM, HTN, BPH SP TURP presented to hospital with complaints of blood in the urine. Urology consulted.  Assessment and Plan: Hematuria. After TURP. Appreciate urology consultation but CBI will be initiated. Will monitor.  Concern for UTI. Less likely. Currently being treated with IV Rocephin .  On several monitored cultures.  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 Baseline creatinine around 1.3. Worsening to 2.0. With IV fluid minimal improvement. Will monitor.  Type II DM. Continue sliding scale insulin . HTN.  Send blood pressure stable. For now monitor.  HLD. Continue statin.   Subjective: No nausea no vomiting no fever no chills.  Physical Exam: General: in Mild distress, No Rash Cardiovascular: S1 and S2 Present, No Murmur Respiratory: Good respiratory effort, Bilateral Air entry present. No Crackles, No wheezes Abdomen: Bowel Sound present, No tenderness Extremities: No edema Neuro: Alert and oriented x3, no new focal deficit  Data Reviewed: I have Reviewed nursing notes, Vitals, and Lab results. Since last encounter, pertinent lab results CBC and BMP   . I have ordered test including CBC and BMP  .  Discussed with urology.  Disposition: Status is: Inpatient Remains inpatient appropriate because: Monitor for improvement in renal function.  SCDs Start: 09/22/23 2241   Family Communication: No one at bedside Level of care: Med-Surg   Vitals:   09/23/23 0241 09/23/23 0622 09/23/23 1142 09/23/23 1645  BP: (!) 158/75 (!) 144/80 137/75 (!) 148/77  Pulse: 88 85 86 88  Resp: 16 20 18 18   Temp: 97.8 F (36.6 C) 99.2 F (37.3 C) 100 F (37.8 C) 99.7 F (37.6 C)  TempSrc:    Oral  SpO2: 99% 100% 99% 99%  Weight:    87.7 kg  Height:    5' 9 (1.753 m)      Author: Yetta Blanch, MD 09/23/2023 6:34 PM  Please look on [email or web address] to find out who is on call.

## 2023-09-24 DIAGNOSIS — R310 Gross hematuria: Secondary | ICD-10-CM | POA: Diagnosis not present

## 2023-09-24 LAB — GLUCOSE, CAPILLARY
Glucose-Capillary: 151 mg/dL — ABNORMAL HIGH (ref 70–99)
Glucose-Capillary: 203 mg/dL — ABNORMAL HIGH (ref 70–99)
Glucose-Capillary: 149 mg/dL — ABNORMAL HIGH (ref 70–99)

## 2023-09-24 LAB — CBC WITH DIFFERENTIAL/PLATELET
Abs Immature Granulocytes: 0.15 10*3/uL — ABNORMAL HIGH (ref 0.00–0.07)
Basophils Absolute: 0 10*3/uL (ref 0.0–0.1)
Basophils Relative: 0 %
Eosinophils Absolute: 0.1 10*3/uL (ref 0.0–0.5)
Eosinophils Relative: 1 %
HCT: 31.4 % — ABNORMAL LOW (ref 39.0–52.0)
Hemoglobin: 10.3 g/dL — ABNORMAL LOW (ref 13.0–17.0)
Immature Granulocytes: 2 %
Lymphocytes Relative: 19 %
Lymphs Abs: 2 10*3/uL (ref 0.7–4.0)
MCH: 26.2 pg (ref 26.0–34.0)
MCHC: 32.8 g/dL (ref 30.0–36.0)
MCV: 79.9 fL — ABNORMAL LOW (ref 80.0–100.0)
Monocytes Absolute: 0.7 10*3/uL (ref 0.1–1.0)
Monocytes Relative: 7 %
Neutro Abs: 7.2 10*3/uL (ref 1.7–7.7)
Neutrophils Relative %: 71 %
Platelets: 472 10*3/uL — ABNORMAL HIGH (ref 150–400)
RBC: 3.93 MIL/uL — ABNORMAL LOW (ref 4.22–5.81)
RDW: 13.6 % (ref 11.5–15.5)
WBC: 10.1 10*3/uL (ref 4.0–10.5)
nRBC: 0 % (ref 0.0–0.2)

## 2023-09-24 LAB — RENAL FUNCTION PANEL
Albumin: 2.3 g/dL — ABNORMAL LOW (ref 3.5–5.0)
Anion gap: 7 (ref 5–15)
BUN: 19 mg/dL (ref 8–23)
CO2: 21 mmol/L — ABNORMAL LOW (ref 22–32)
Calcium: 8.1 mg/dL — ABNORMAL LOW (ref 8.9–10.3)
Chloride: 107 mmol/L (ref 98–111)
Creatinine, Ser: 1.73 mg/dL — ABNORMAL HIGH (ref 0.61–1.24)
GFR, Estimated: 41 mL/min — ABNORMAL LOW (ref >60)
Glucose, Bld: 167 mg/dL — ABNORMAL HIGH (ref 70–99)
Phosphorus: 3 mg/dL (ref 2.5–4.6)
Potassium: 3.3 mmol/L — ABNORMAL LOW (ref 3.5–5.1)
Sodium: 135 mmol/L (ref 135–145)

## 2023-09-24 LAB — MAGNESIUM: Magnesium: 2.3 mg/dL (ref 1.7–2.4)

## 2023-09-24 MED ORDER — INSULIN ASPART 100 UNIT/ML IJ SOLN
0.0000 [IU] | Freq: Three times a day (TID) | INTRAMUSCULAR | Status: DC
Start: 2023-09-24 — End: 2023-09-25
  Administered 2023-09-24 – 2023-09-25 (×2): 3 [IU] via SUBCUTANEOUS
  Administered 2023-09-25: 1 [IU] via SUBCUTANEOUS

## 2023-09-24 MED ORDER — INSULIN ASPART 100 UNIT/ML IJ SOLN
0.0000 [IU] | INTRAMUSCULAR | Status: DC
  Administered 2023-09-24: 3 [IU] via SUBCUTANEOUS

## 2023-09-24 MED ORDER — SODIUM CHLORIDE 0.9 % IV SOLN: INTRAVENOUS | Status: DC

## 2023-09-24 MED ORDER — INSULIN ASPART 100 UNIT/ML IJ SOLN: 0.0000 [IU] | Freq: Every day | INTRAMUSCULAR | Status: DC

## 2023-09-24 MED ORDER — INSULIN GLARGINE-YFGN 100 UNIT/ML ~~LOC~~ SOLN
17.0000 [IU] | Freq: Every day | SUBCUTANEOUS | Status: DC
  Administered 2023-09-24: 17 [IU] via SUBCUTANEOUS
  Filled 2023-09-24 (×2): qty 0.17

## 2023-09-24 NOTE — Plan of Care (Signed)
  Problem: Pain Management: Goal: General experience of comfort will improve Outcome: Progressing   Problem: Education: Goal: Ability to describe self-care measures that may prevent or decrease complications (Diabetes Survival Skills Education) will improve Outcome: Progressing   Problem: Coping: Goal: Ability to adjust to condition or change in health will improve Outcome: Progressing

## 2023-09-24 NOTE — Progress Notes (Signed)
  Subjective: Denies pain. Urine clear yellow on slow drip.  Objective: Vital signs in last 24 hours: Temp:  [98.7 F (37.1 C)-100 F (37.8 C)] 98.7 F (37.1 C) (01/01 0537) Pulse Rate:  [81-88] 81 (01/01 0537) Resp:  [18-20] 20 (01/01 0537) BP: (131-148)/(75-81) 137/81 (01/01 0537) SpO2:  [97 %-99 %] 99 % (01/01 0537) Weight:  [87.7 kg] 87.7 kg (12/31 1645)  Intake/Output from previous day: 12/31 0701 - 01/01 0700 In: 2787 [P.O.:480; I.V.:2207; IV Piggyback:100] Out: 5150 [Urine:5150] Intake/Output this shift: Total I/O In: -  Out: 650 [Urine:650] UOP: 5L on CBI, clear  Physical Exam:  General: Alert and oriented CV: RRR Lungs: Clear Abdomen: Soft, ND, NT Ext: NT, No erythema  Lab Results: Recent Labs    09/22/23 1553 09/23/23 0046 09/24/23 0827  HGB 11.3* 10.4* 10.3*  HCT 33.7* 33.0* 31.4*   BMET Recent Labs    09/23/23 0046 09/24/23 0827  NA 131* 135  K 4.2 3.3*  CL 105 107  CO2 18* 21*  GLUCOSE 207* 167*  BUN 21 19  CREATININE 1.95* 1.73*  CALCIUM  7.9* 8.1*     Studies/Results: No results found.  Assessment/Plan: History of prostate cancer and BPH s/p TURP with post-operative hematuria  -Foley clear yellow on slow drip -CBI clamped -Irrigate as needed for clots or decreased drainage -Anticipate void trial and discharge home tomorrow   LOS: 2 days   Matt R. Alyissa Whidbee MD 09/24/2023, 10:00 AM [facility]  Pager: [phone]

## 2023-09-24 NOTE — Progress Notes (Signed)
 [facility] Progress Note Patient: Christian Mclaughlin FMW:[id number] DOB: 1946/02/27 DOA: 09/22/2023  DOS: the patient was seen and examined on 09/24/2023  Brief Hospital Course: PMH of type II DM, HTN, BPH SP TURP presented to hospital with complaints of blood in the urine. Urology consulted.  Assessment and Plan: Hematuria. After TURP. Appreciate urology consultation but Urine appears to be clearing out with CBI.  Likely plan is to remove Foley catheter tomorrow. Will monitor.  Gram-negative and Enterococcus faecalis Currently being treated with IV Rocephin .  Monitor results for now.  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 Baseline creatinine around 1.3. Worsening to 2.0. Continue with IV fluid.  Type II DM. Continue sliding scale insulin . HTN.  Send blood pressure stable. For now monitor.  HLD. Continue statin.   Subjective: No complaint.  No nausea no vomiting.  Constipation resolved.  Physical Exam: In mild distress. S1-S2 present pretension was not present. Nontender.  Data Reviewed: I have Reviewed nursing notes, Vitals, and Lab results. Reviewed CBC and CMP.  Reordered CBC and CMP.  Disposition: Status is: Inpatient Remains inpatient appropriate because: Monitor for improvement in renal function.  SCDs Start: 09/22/23 2241   Family Communication: No one at bedside Level of care: Med-Surg   Vitals:   09/23/23 2104 09/24/23 0118 09/24/23 0537 09/24/23 1126  BP: 131/76 136/79 137/81 122/67  Pulse: 86 84 81 79  Resp: 20 20 20 20   Temp: 99.9 F (37.7 C) 98.7 F (37.1 C) 98.7 F (37.1 C) 98.9 F (37.2 C)  TempSrc: Oral Oral Oral Oral  SpO2: 97% 98% 99% 98%  Weight:      Height:         Author: Yetta Blanch, MD 09/24/2023 6:02 PM  Please look on [email or web address] to find out who is on call.

## 2023-09-25 DIAGNOSIS — R310 Gross hematuria: Secondary | ICD-10-CM | POA: Diagnosis not present

## 2023-09-25 LAB — URINE CULTURE: Culture: >=100000 — AB

## 2023-09-25 LAB — RENAL FUNCTION PANEL
Albumin: 2.3 g/dL — ABNORMAL LOW (ref 3.5–5.0)
Anion gap: 4 — ABNORMAL LOW (ref 5–15)
BUN: 15 mg/dL (ref 8–23)
CO2: 22 mmol/L (ref 22–32)
Calcium: 8.2 mg/dL — ABNORMAL LOW (ref 8.9–10.3)
Chloride: 109 mmol/L (ref 98–111)
Creatinine, Ser: 1.46 mg/dL — ABNORMAL HIGH (ref 0.61–1.24)
GFR, Estimated: 50 mL/min — ABNORMAL LOW (ref >60)
Glucose, Bld: 130 mg/dL — ABNORMAL HIGH (ref 70–99)
Phosphorus: 3.3 mg/dL (ref 2.5–4.6)
Potassium: 3.3 mmol/L — ABNORMAL LOW (ref 3.5–5.1)
Sodium: 135 mmol/L (ref 135–145)

## 2023-09-25 LAB — GLUCOSE, CAPILLARY
Glucose-Capillary: 126 mg/dL — ABNORMAL HIGH (ref 70–99)
Glucose-Capillary: 236 mg/dL — ABNORMAL HIGH (ref 70–99)

## 2023-09-25 MED ORDER — LEVOFLOXACIN 500 MG PO TABS
250.0000 mg | ORAL_TABLET | ORAL | Status: DC
  Administered 2023-09-25: 250 mg via ORAL
  Filled 2023-09-25: qty 1

## 2023-09-25 MED ORDER — LEVOFLOXACIN 250 MG PO TABS: 250.0000 mg | ORAL_TABLET | Freq: Every day | ORAL | 0 refills | Status: AC

## 2023-09-25 MED ORDER — POTASSIUM CHLORIDE CRYS ER 20 MEQ PO TBCR
40.0000 meq | EXTENDED_RELEASE_TABLET | Freq: Once | ORAL | Status: AC
Start: 2023-09-25 — End: 2023-09-25
  Administered 2023-09-25: 40 meq via ORAL
  Filled 2023-09-25: qty 2

## 2023-09-25 NOTE — Plan of Care (Signed)
   Problem: Education: Goal: Knowledge of General Education information will improve Description: Including pain rating scale, medication(s)/side effects and non-pharmacologic comfort measures Outcome: Progressing   Problem: Safety: Goal: Ability to remain free from injury will improve Outcome: Progressing

## 2023-09-25 NOTE — Progress Notes (Signed)
 Mobility Specialist - Progress Note   09/25/23 1358  Mobility  Activity Ambulated independently in hallway  Level of Assistance Independent  Assistive Device None  Distance Ambulated (ft) 350 ft  Activity Response Tolerated well  Mobility Referral Yes  Mobility visit 1 Mobility  Mobility Specialist Start Time (ACUTE ONLY) 1352  Mobility Specialist Stop Time (ACUTE ONLY) 1357  Mobility Specialist Time Calculation (min) (ACUTE ONLY) 5 min   Pt received in bed and agreeable to mobility. No complaints during session. Pt to bed after session with all needs met.    [facility]

## 2023-09-25 NOTE — Discharge Summary (Signed)
 Physician Discharge Summary   Patient: Christian Mclaughlin MRN: [id number] DOB: 01-31-1946  Admit date:     09/22/2023  Discharge date: 09/25/2023  Discharge Physician: Yetta Blanch  PCP: Christian Mclaughlin, Veva LABOR, Christian Mclaughlin  Recommendations at discharge: Follow-up with PCP. Follow-up with urology.   Follow-up Information     Christian Mclaughlin, Christian Mclaughlin, Christian Mclaughlin. Schedule an appointment as soon as possible for Mclaughlin visit in 1 week(s).   Specialty: Pain Medicine Why: with BMP lab to look at kidney and electrolytes, with CBC lab to look at blood counts Contact information: [address] [place] [address] [phone]         Christian Donnice SAUNDERS, Christian Mclaughlin. Schedule an appointment as soon as possible for Mclaughlin visit in 2 week(s).   Specialty: Urology Contact information: [address] [place] [address] [phone]                Discharge Diagnoses: Principal Problem:   Hematuria Active Problems:   CVA (cerebral vascular accident) (HCC)   Hypertension   DM2 (diabetes mellitus, type 2) (HCC)   Hyperlipidemia   Malignant neoplasm of prostate (HCC)   BPH (benign prostatic hyperplasia)  Hospital Course: PMH of type II DM, HTN, BPH SP TURP presented to hospital with complaints of blood in the urine. Urology consulted.  Assessment and Plan: Hematuria. After TURP. Appreciate urology consultation but Urine appears to be clearing out with CBI.  Foley catheter removed.  Patient able to void.  Had some pink-tinged urine.  No nausea no vomiting. Will hold off on aspirin  and Plavix  for another few days before resuming.   ENTEROBACTER CLOACAE and Enterococcus faecalis infection. Urine culture is positive for above bacteria. Initially patient was on Rocephin . Clinically improving. But due to patient's recent procedure as well as need for frequent catheterization we will cover this with 2 weeks of antibiotics as recommended by urology. Antibiotic switched to Levaquin .   Acute kidney injury on  CKD 3A. Baseline creatinine around 1.3. Worsening to 2.0. Treated with IV fluid with improvement.   Type II DM. Resuming home medication.  HTN.   Blood pressure was stable.  Later on as patient improved blood pressure increase. Will resume home medication.   HLD. Continue statin.   Consultants:  Urology  Procedures performed:  Foley catheter placement and CBI  DISCHARGE MEDICATION: Allergies as of 09/25/2023   No Known Allergies      Medication List     PAUSE taking these medications    aspirin  EC 81 MG tablet Wait to take this until: September 29, 2023 Take 81 mg by mouth daily.   clopidogrel  75 MG tablet Wait to take this until: September 29, 2023 Commonly known as: PLAVIX  Take 1 tablet (75 mg total) by mouth daily.       STOP taking these medications    cyclobenzaprine  10 MG tablet Commonly known as: FLEXERIL    lisinopril  40 MG tablet Commonly known as: ZESTRIL    miconazole 2 % powder Commonly known as: MICOTIN       TAKE these medications    amLODipine  10 MG tablet Commonly known as: NORVASC  Take 10 mg by mouth daily.   atorvastatin  40 MG tablet Commonly known as: LIPITOR Take 1 tablet (40 mg total) by mouth daily at 6 PM. What changed: when to take this   docusate sodium  100 MG capsule Commonly known as: Colace Take 1 capsule (100 mg total) by mouth daily as needed for up to 30 doses. What changed: when to  take this   ferrous sulfate  325 (65 FE) MG EC tablet Take 325 mg by mouth daily.   Fiber 625 MG Tabs Take 2 tablets by mouth daily.   finasteride  5 MG tablet Commonly known as: PROSCAR  Take 5 mg by mouth daily.   levofloxacin  250 MG tablet Commonly known as: LEVAQUIN  Take 1 tablet (250 mg total) by mouth daily for 13 days. Start taking on: September 26, 2023   metoprolol  succinate 50 MG 24 hr tablet Commonly known as: TOPROL -XL Take 25 mg by mouth daily. Takes half tablet  daily   polyethylene glycol 17 g packet Commonly known  as: MIRALAX  / GLYCOLAX  Take 17 g by mouth daily as needed for mild constipation or moderate constipation.   PRESCRIPTION MEDICATION Take 4 sprays by mouth 4 (four) times daily as needed. ARTIFICIAL SALIVA ORAL SPRAY  (GETS [facility])   Semaglutide (1 MG/DOSE) 4 MG/3ML Sopn Inject 1 mg into the skin once Mclaughlin week. Wednesday's  (ozempic)   Semglee  (yfgn) 100 UNIT/ML Pen Generic drug: insulin  glargine-yfgn Inject 17 Units into the skin at bedtime. [facility] substitute for Lantus    SYSTANE OP Place 1 drop into both eyes as needed (dry eye).   terazosin  5 MG capsule Commonly known as: HYTRIN  Take 5 mg by mouth at bedtime.       Disposition: Home Diet recommendation: Cardiac diet  Discharge Exam: Vitals:   09/25/23 0416 09/25/23 0817 09/25/23 1251 09/25/23 1306  BP: (!) 147/91 (!) 159/84 (!) 177/87 (!) 166/94  Pulse: 76 74 77 75  Resp: 19  18   Temp: 98.2 F (36.8 C)  97.7 F (36.5 C)   TempSrc: Oral  Oral   SpO2: 100%  100%   Weight:      Height:       General: Appear in no distress; no visible Abnormal Neck Mass Or lumps, Conjunctiva normal Cardiovascular: S1 and S2 Present, no Murmur, Respiratory: good respiratory effort, Bilateral Air entry present and CTA, no Crackles, no wheezes Abdomen: Bowel Sound present, Non tender  Extremities: no Pedal edema Neurology: alert and oriented to time, place, and person  Filed Weights   09/22/23 1405 09/23/23 0121 09/23/23 1645  Weight: 89.4 kg 84.5 kg 87.7 kg   Condition at discharge: stable  The results of significant diagnostics from this hospitalization (including imaging, microbiology, ancillary and laboratory) are listed below for reference.   Imaging Studies: No results found.  Microbiology: Results for orders placed or performed during the hospital encounter of 09/22/23  Urine Culture     Status: Abnormal   Collection Time: 09/22/23  3:50 PM   Specimen: Urine, Clean Catch  Result Value Ref Range Status   Specimen  Description   Final    URINE, CLEAN CATCH Performed at [facility], [address] [place], [place] [address]    Special Requests   Final    NONE Performed at [facility], [address] [place], [place] [address]    Culture (Mclaughlin)  Final    >=100,000 COLONIES/mL ENTEROBACTER CLOACAE >=100,000 COLONIES/mL ENTEROCOCCUS FAECALIS    Report Status 09/25/2023 FINAL  Final   Organism ID, Bacteria ENTEROBACTER CLOACAE (Mclaughlin)  Final   Organism ID, Bacteria ENTEROCOCCUS FAECALIS (Mclaughlin)  Final      Susceptibility   Enterobacter cloacae - MIC*    CEFEPIME <=0.12 SENSITIVE Sensitive     CIPROFLOXACIN <=0.25 SENSITIVE Sensitive     GENTAMICIN <=1 SENSITIVE Sensitive  IMIPENEM <=0.25 SENSITIVE Sensitive     NITROFURANTOIN 32 SENSITIVE Sensitive     TRIMETH/SULFA <=20 SENSITIVE Sensitive     PIP/TAZO <=4 SENSITIVE Sensitive ug/mL    * >=100,000 COLONIES/mL ENTEROBACTER CLOACAE   Enterococcus faecalis - MIC*    AMPICILLIN <=2 SENSITIVE Sensitive     LEVOFLOXACIN  1 SENSITIVE Sensitive     NITROFURANTOIN <=16 SENSITIVE Sensitive     VANCOMYCIN 1 SENSITIVE Sensitive     * >=100,000 COLONIES/mL ENTEROCOCCUS FAECALIS   Labs: CBC: Recent Labs  Lab 09/22/23 1553 09/23/23 0046 09/24/23 0827  WBC 15.5* 13.3* 10.1  NEUTROABS 13.0*  --  7.2  HGB 11.3* 10.4* 10.3*  HCT 33.7* 33.0* 31.4*  MCV 78.0* 82.5 79.9*  PLT 503* 454* 472*   Basic Metabolic Panel: Recent Labs  Lab 09/22/23 1553 09/23/23 0046 09/24/23 0827 09/25/23 0833  NA 133* 131* 135 135  K 4.4 4.2 3.3* 3.3*  CL 102 105 107 109  CO2 21* 18* 21* 22  GLUCOSE 181* 207* 167* 130*  BUN 23 21 19 15   CREATININE 2.03* 1.95* 1.73* 1.46*  CALCIUM  8.9 7.9* 8.1* 8.2*  MG  --   --  2.3  --   PHOS  --   --  3.0 3.3   Liver Function Tests: Recent Labs  Lab 09/22/23 1553 09/24/23 0827 09/25/23 0833  AST 20  --   --   ALT 29  --   --   ALKPHOS 72  --   --   BILITOT 0.9  --   --   PROT 8.0  --   --   ALBUMIN  3.0* 2.3* 2.3*   CBG: Recent Labs  Lab 09/24/23 0738 09/24/23 1122 09/24/23 2105 09/25/23 0734 09/25/23 1149  GLUCAP 151* 203* 149* 126* 236*    Discharge time spent: greater than 30 minutes.  Author: Yetta Blanch, Christian Mclaughlin  [facility] 09/25/2023

## 2023-09-25 NOTE — Progress Notes (Addendum)
  Subjective: Denies pain. Urine clear yellow with clamped CBI.  Objective: Vital signs in last 24 hours: Temp:  [98.2 F (36.8 C)-98.9 F (37.2 C)] 98.2 F (36.8 C) (01/02 0416) Pulse Rate:  [74-79] 74 (01/02 0817) Resp:  [16-20] 19 (01/02 0416) BP: (122-159)/(67-91) 159/84 (01/02 0817) SpO2:  [98 %-100 %] 100 % (01/02 0416)  Intake/Output from previous day: 01/01 0701 - 01/02 0700 In: 490.3 [P.O.:360; I.V.:130.3] Out: 2750 [Urine:2750] Intake/Output this shift: No intake/output data recorded. UOP: 5L on CBI, clear  Physical Exam:  General: Alert and oriented CV: RRR Lungs: Clear Abdomen: Soft, ND, NT Ext: NT, No erythema  Lab Results: Recent Labs    09/22/23 1553 09/23/23 0046 09/24/23 0827  HGB 11.3* 10.4* 10.3*  HCT 33.7* 33.0* 31.4*   BMET Recent Labs    09/24/23 0827 09/25/23 0833  NA 135 135  K 3.3* 3.3*  CL 107 109  CO2 21* 22  GLUCOSE 167* 130*  BUN 19 15  CREATININE 1.73* 1.46*  CALCIUM  8.1* 8.2*     Studies/Results: No results found.  Assessment/Plan: History of prostate cancer and BPH s/p TURP with post-operative hematuria UTI  -Enterobacter and enterococcal positive urine culture. 2 weeks culture directed ABX. -Foley clear yellow -CBI clamped. Fill and pull this am -Irrigate as needed for clots or decreased drainage -OK to discharge from a urologic perspective once pt has passed TOV.  -Pt to follow up in clinic in 2-4 weeks    LOS: 3 days   Ole Bourdon, NP [facility] Pager: [phone]   I have seen and examined the patient and agree with the above assessment and plan.  Passed void trial. Ok to discharge home and f/u in clinic.  Matt R. Breton Berns MD [facility]  Pager: [phone]

## 2023-09-25 NOTE — Plan of Care (Signed)
  Problem: Clinical Measurements: Goal: Will remain free from infection Outcome: Progressing Goal: Respiratory complications will improve Outcome: Progressing Goal: Cardiovascular complication will be avoided Outcome: Progressing   Problem: Nutrition: Goal: Adequate nutrition will be maintained Outcome: Progressing   Problem: Safety: Goal: Ability to remain free from injury will improve Outcome: Progressing   Problem: Coping: Goal: Ability to adjust to condition or change in health will improve Outcome: Progressing

## 2023-09-25 NOTE — TOC CM/SW Note (Signed)
 Transition of Care [facility]) - Inpatient Brief Assessment   Patient Details  Name: IDO WOLLMAN MRN: [id number] Date of Birth: [birth year]  [facility]) CM/SW Contact:    Alfonse JONELLE Rex, RN Phone Number: 09/25/2023, 1:33 PM   Clinical Narrative: Met with pt at bedside to introduce role of TOC/NCM and review for dc planning, pt confirmed he has an established PCP and pharmacy, no current home care services or home DME, reports he feels safe returning home with support from his spouse, confirmed transportation available at discharge. TOC Brief Assessment completed. No TOC needs identified at this time.     Transition of Care Asessment: Insurance and Status: Insurance coverage has been reviewed Patient has primary care physician: Yes Home environment has been reviewed: resides in private residence with spouse Prior level of function:: Independent Prior/Current Home Services: No current home services Social Drivers of Health Review: SDOH reviewed no interventions necessary Readmission risk has been reviewed: Yes Transition of care needs: no transition of care needs at this time

## 2023-09-26 ENCOUNTER — Telehealth: Payer: Self-pay | Admitting: *Deleted

## 2023-09-26 NOTE — Telephone Encounter (Signed)
 CALLED PATIENT TO REMIND OF SIM APPT. FOR 09-29-23- ARRIVAL TIME- 12:45 PM @ [facility], INFORMED PATIENT TO ARRIVE WITH A FULL BLADDER, SPOKE WITH PATIENT AND HE IS AWARE OF THIS APPT. AND THE INSTRUCTIONS

## 2023-09-26 NOTE — Telephone Encounter (Signed)
 Called patient to ask about rescheduling sim appt. for 10-14-23- arrival time- 2:45 pm @ [facility], informed patient to arrive with a full bladder, spoke with patient and he is aware of this new date and time and the instructions

## 2023-09-29 ENCOUNTER — Ambulatory Visit: Payer: Medicare Other | Admitting: Radiation Oncology

## 2023-10-06 ENCOUNTER — Ambulatory Visit: Payer: TRICARE For Life (TFL) | Admitting: Radiation Oncology

## 2023-10-06 NOTE — Progress Notes (Signed)
 Patient is scheduled for CT Sim on 1/21.  RN left message for patient to call back prior to upcoming start of treatment.

## 2023-10-08 ENCOUNTER — Ambulatory Visit: Payer: Medicare Other

## 2023-10-09 ENCOUNTER — Ambulatory Visit: Payer: Medicare Other

## 2023-10-10 ENCOUNTER — Ambulatory Visit: Payer: Medicare Other

## 2023-10-13 ENCOUNTER — Telehealth: Payer: Self-pay | Admitting: *Deleted

## 2023-10-13 ENCOUNTER — Ambulatory Visit: Payer: Medicare Other

## 2023-10-13 NOTE — Telephone Encounter (Signed)
CALLED PATIENT TO REMIND OF SIM APPT. FOR 10-14-23- ARRIVAL TIME- 2:45 PM @ [facility], INFORMED PATIENT TO ARRIVE WITH A FULL BLADDER, SPOKE WITH PATIENT AND HE IS AWARE OF THIS APPT. AND THE INSTRUCTIONS

## 2023-10-14 ENCOUNTER — Ambulatory Visit: Payer: Medicare Other

## 2023-10-14 ENCOUNTER — Ambulatory Visit
Admission: RE | Admit: 2023-10-14 | Discharge: 2023-10-14 | Disposition: A | Discharge status: Home / Self Care | Payer: Medicare Other | Source: Ambulatory Visit | Attending: Radiation Oncology | Admitting: Radiation Oncology

## 2023-10-14 DIAGNOSIS — Z510 Encounter for antineoplastic radiation therapy: Secondary | ICD-10-CM | POA: Insufficient documentation

## 2023-10-14 DIAGNOSIS — C61 Malignant neoplasm of prostate: Secondary | ICD-10-CM | POA: Insufficient documentation

## 2023-10-14 NOTE — Progress Notes (Signed)
  Radiation Oncology         [phone] ________________________________  Name: Christian Mclaughlin MRN: [id number]  Date: 10/14/2023  DOB: 1946/05/21  SIMULATION AND TREATMENT PLANNING NOTE    ICD-10-CM   1. Malignant neoplasm of prostate (HCC)  C61       DIAGNOSIS:   78 y.o. gentleman with Stage T1c adenocarcinoma of the prostate with Gleason score of 3+4, and PSA of 5 (adjusted for finasteride).  NARRATIVE:  The patient was brought to the CT Simulation planning suite.  Identity was confirmed.  All relevant records and images related to the planned course of therapy were reviewed.  The patient freely provided informed written consent to proceed with treatment after reviewing the details related to the planned course of therapy. The consent form was witnessed and verified by the simulation staff.  Then, the patient was set-up in a stable reproducible supine position for radiation therapy.  A vacuum lock pillow device was custom fabricated to position his legs in a reproducible immobilized position.  Then, supervised the performance of a urethrogram under sterile conditions to identify the prostatic apex.  CT images were obtained.  Surface markings were placed.  The CT images were loaded into the planning software.  Then the prostate target and avoidance structures including the rectum, bladder, bowel and hips were contoured.  Treatment planning then occurred.  The radiation prescription was entered and confirmed.  A total of one complex treatment devices was fabricated. I have requested : Intensity Modulated Radiotherapy (IMRT) is medically necessary for this case for the following reason:  Rectal sparing.  I have requested daily cone beam CT volumetric image gudiance to track gold fiducial posiitoning along with bladder and rectal filling, this is medically necessary to assure accurate positioning of high dose radiation.  PLAN:  The patient will receive 70 Gy in 28  fractions.  ________________________________  Artist Pais Kathrynn Running, M.D.

## 2023-10-15 ENCOUNTER — Ambulatory Visit: Payer: Medicare Other

## 2023-10-15 DIAGNOSIS — Z510 Encounter for antineoplastic radiation therapy: Secondary | ICD-10-CM | POA: Diagnosis not present

## 2023-10-16 ENCOUNTER — Ambulatory Visit: Payer: Medicare Other

## 2023-10-17 ENCOUNTER — Ambulatory Visit: Payer: Medicare Other

## 2023-10-20 ENCOUNTER — Ambulatory Visit: Payer: Medicare Other

## 2023-10-21 ENCOUNTER — Ambulatory Visit: Payer: Medicare Other

## 2023-10-22 ENCOUNTER — Ambulatory Visit: Payer: Medicare Other

## 2023-10-23 ENCOUNTER — Other Ambulatory Visit: Payer: Self-pay

## 2023-10-23 ENCOUNTER — Ambulatory Visit
Admission: RE | Admit: 2023-10-23 | Discharge: 2023-10-23 | Disposition: A | Discharge status: Home / Self Care | Payer: TRICARE For Life (TFL) | Source: Ambulatory Visit | Attending: Radiation Oncology | Admitting: Radiation Oncology

## 2023-10-23 ENCOUNTER — Ambulatory Visit: Payer: Medicare Other

## 2023-10-23 DIAGNOSIS — Z510 Encounter for antineoplastic radiation therapy: Secondary | ICD-10-CM | POA: Diagnosis not present

## 2023-10-23 LAB — RAD ONC ARIA SESSION SUMMARY
Course Elapsed Days: 0
Plan Fractions Treated to Date: 1
Plan Prescribed Dose Per Fraction: 2.5 Gy
Plan Total Fractions Prescribed: 28
Plan Total Prescribed Dose: 70 Gy
Reference Point Dosage Given to Date: 2.5 Gy
Reference Point Session Dosage Given: 2.5 Gy
Session Number: 1

## 2023-10-24 ENCOUNTER — Ambulatory Visit
Admission: RE | Admit: 2023-10-24 | Discharge: 2023-10-24 | Disposition: A | Discharge status: Home / Self Care | Payer: TRICARE For Life (TFL) | Source: Ambulatory Visit | Attending: Radiation Oncology

## 2023-10-24 ENCOUNTER — Other Ambulatory Visit: Payer: Self-pay

## 2023-10-24 ENCOUNTER — Ambulatory Visit: Payer: Medicare Other

## 2023-10-24 DIAGNOSIS — Z510 Encounter for antineoplastic radiation therapy: Secondary | ICD-10-CM | POA: Diagnosis not present

## 2023-10-24 LAB — RAD ONC ARIA SESSION SUMMARY
Course Elapsed Days: 1
Plan Fractions Treated to Date: 2
Plan Prescribed Dose Per Fraction: 2.5 Gy
Plan Total Fractions Prescribed: 28
Plan Total Prescribed Dose: 70 Gy
Reference Point Dosage Given to Date: 5 Gy
Reference Point Session Dosage Given: 2.5 Gy
Session Number: 2

## 2023-10-27 ENCOUNTER — Ambulatory Visit
Admission: RE | Admit: 2023-10-27 | Discharge: 2023-10-27 | Disposition: A | Discharge status: Home / Self Care | Payer: TRICARE For Life (TFL) | Source: Ambulatory Visit | Attending: Radiation Oncology | Admitting: Radiation Oncology

## 2023-10-27 ENCOUNTER — Ambulatory Visit: Payer: Medicare Other

## 2023-10-27 ENCOUNTER — Other Ambulatory Visit: Payer: Self-pay

## 2023-10-27 DIAGNOSIS — C61 Malignant neoplasm of prostate: Secondary | ICD-10-CM | POA: Diagnosis present

## 2023-10-27 DIAGNOSIS — Z510 Encounter for antineoplastic radiation therapy: Secondary | ICD-10-CM | POA: Insufficient documentation

## 2023-10-27 LAB — RAD ONC ARIA SESSION SUMMARY
Course Elapsed Days: 4
Plan Fractions Treated to Date: 3
Plan Prescribed Dose Per Fraction: 2.5 Gy
Plan Total Fractions Prescribed: 28
Plan Total Prescribed Dose: 70 Gy
Reference Point Dosage Given to Date: 7.5 Gy
Reference Point Session Dosage Given: 2.5 Gy
Session Number: 3

## 2023-10-28 ENCOUNTER — Ambulatory Visit: Payer: Medicare Other

## 2023-10-28 ENCOUNTER — Other Ambulatory Visit: Payer: Self-pay

## 2023-10-28 ENCOUNTER — Ambulatory Visit
Admission: RE | Admit: 2023-10-28 | Discharge: 2023-10-28 | Disposition: A | Discharge status: Home / Self Care | Payer: Medicare Other | Source: Ambulatory Visit | Attending: Radiation Oncology

## 2023-10-28 DIAGNOSIS — Z510 Encounter for antineoplastic radiation therapy: Secondary | ICD-10-CM | POA: Diagnosis not present

## 2023-10-28 LAB — RAD ONC ARIA SESSION SUMMARY
Course Elapsed Days: 5
Plan Fractions Treated to Date: 4
Plan Prescribed Dose Per Fraction: 2.5 Gy
Plan Total Fractions Prescribed: 28
Plan Total Prescribed Dose: 70 Gy
Reference Point Dosage Given to Date: 10 Gy
Reference Point Session Dosage Given: 2.5 Gy
Session Number: 4

## 2023-10-29 ENCOUNTER — Other Ambulatory Visit: Payer: Self-pay

## 2023-10-29 ENCOUNTER — Ambulatory Visit
Admission: RE | Admit: 2023-10-29 | Discharge: 2023-10-29 | Discharge status: Home / Self Care | Payer: Medicare Other | Source: Ambulatory Visit | Attending: Radiation Oncology

## 2023-10-29 ENCOUNTER — Ambulatory Visit: Payer: Medicare Other

## 2023-10-29 DIAGNOSIS — Z510 Encounter for antineoplastic radiation therapy: Secondary | ICD-10-CM | POA: Diagnosis not present

## 2023-10-29 LAB — RAD ONC ARIA SESSION SUMMARY
Course Elapsed Days: 6
Plan Fractions Treated to Date: 5
Plan Prescribed Dose Per Fraction: 2.5 Gy
Plan Total Fractions Prescribed: 28
Plan Total Prescribed Dose: 70 Gy
Reference Point Dosage Given to Date: 12.5 Gy
Reference Point Session Dosage Given: 2.5 Gy
Session Number: 5

## 2023-10-30 ENCOUNTER — Ambulatory Visit: Payer: Medicare Other

## 2023-10-30 ENCOUNTER — Other Ambulatory Visit: Payer: Self-pay

## 2023-10-30 ENCOUNTER — Ambulatory Visit
Admission: RE | Admit: 2023-10-30 | Discharge: 2023-10-30 | Disposition: A | Discharge status: Home / Self Care | Payer: Medicare Other | Source: Ambulatory Visit | Attending: Radiation Oncology | Admitting: Radiation Oncology

## 2023-10-30 DIAGNOSIS — Z510 Encounter for antineoplastic radiation therapy: Secondary | ICD-10-CM | POA: Diagnosis not present

## 2023-10-30 LAB — RAD ONC ARIA SESSION SUMMARY
Course Elapsed Days: 7
Plan Fractions Treated to Date: 6
Plan Prescribed Dose Per Fraction: 2.5 Gy
Plan Total Fractions Prescribed: 28
Plan Total Prescribed Dose: 70 Gy
Reference Point Dosage Given to Date: 15 Gy
Reference Point Session Dosage Given: 2.5 Gy
Session Number: 6

## 2023-10-31 ENCOUNTER — Ambulatory Visit
Admission: RE | Admit: 2023-10-31 | Discharge: 2023-10-31 | Disposition: A | Discharge status: Home / Self Care | Payer: Medicare Other | Source: Ambulatory Visit | Attending: Radiation Oncology

## 2023-10-31 ENCOUNTER — Ambulatory Visit: Payer: Medicare Other

## 2023-10-31 ENCOUNTER — Other Ambulatory Visit: Payer: Self-pay

## 2023-10-31 ENCOUNTER — Ambulatory Visit
Admission: RE | Admit: 2023-10-31 | Discharge: 2023-10-31 | Disposition: A | Discharge status: Home / Self Care | Payer: Medicare Other | Source: Ambulatory Visit | Attending: Radiation Oncology | Admitting: Radiation Oncology

## 2023-10-31 DIAGNOSIS — Z510 Encounter for antineoplastic radiation therapy: Secondary | ICD-10-CM | POA: Diagnosis not present

## 2023-10-31 LAB — RAD ONC ARIA SESSION SUMMARY
Course Elapsed Days: 8
Plan Fractions Treated to Date: 7
Plan Prescribed Dose Per Fraction: 2.5 Gy
Plan Total Fractions Prescribed: 28
Plan Total Prescribed Dose: 70 Gy
Reference Point Dosage Given to Date: 17.5 Gy
Reference Point Session Dosage Given: 2.5 Gy
Session Number: 7

## 2023-11-03 ENCOUNTER — Ambulatory Visit
Admission: RE | Admit: 2023-11-03 | Discharge: 2023-11-03 | Disposition: A | Discharge status: Home / Self Care | Payer: Medicare Other | Source: Ambulatory Visit | Attending: Radiation Oncology | Admitting: Radiation Oncology

## 2023-11-03 ENCOUNTER — Ambulatory Visit: Payer: Medicare Other

## 2023-11-03 ENCOUNTER — Other Ambulatory Visit: Payer: Self-pay

## 2023-11-03 DIAGNOSIS — Z510 Encounter for antineoplastic radiation therapy: Secondary | ICD-10-CM | POA: Diagnosis not present

## 2023-11-03 LAB — RAD ONC ARIA SESSION SUMMARY
Course Elapsed Days: 11
Plan Fractions Treated to Date: 8
Plan Prescribed Dose Per Fraction: 2.5 Gy
Plan Total Fractions Prescribed: 28
Plan Total Prescribed Dose: 70 Gy
Reference Point Dosage Given to Date: 20 Gy
Reference Point Session Dosage Given: 2.5 Gy
Session Number: 8

## 2023-11-04 ENCOUNTER — Ambulatory Visit
Admission: RE | Admit: 2023-11-04 | Discharge: 2023-11-04 | Disposition: A | Discharge status: Home / Self Care | Payer: Medicare Other | Source: Ambulatory Visit | Attending: Radiation Oncology | Admitting: Radiation Oncology

## 2023-11-04 ENCOUNTER — Other Ambulatory Visit: Payer: Self-pay

## 2023-11-04 ENCOUNTER — Ambulatory Visit: Payer: Medicare Other

## 2023-11-04 DIAGNOSIS — Z510 Encounter for antineoplastic radiation therapy: Secondary | ICD-10-CM | POA: Diagnosis not present

## 2023-11-04 LAB — RAD ONC ARIA SESSION SUMMARY
Course Elapsed Days: 12
Plan Fractions Treated to Date: 9
Plan Prescribed Dose Per Fraction: 2.5 Gy
Plan Total Fractions Prescribed: 28
Plan Total Prescribed Dose: 70 Gy
Reference Point Dosage Given to Date: 22.5 Gy
Reference Point Session Dosage Given: 2.5 Gy
Session Number: 9

## 2023-11-05 ENCOUNTER — Ambulatory Visit
Admission: RE | Admit: 2023-11-05 | Discharge: 2023-11-05 | Disposition: A | Discharge status: Home / Self Care | Payer: Medicare Other | Source: Ambulatory Visit | Attending: Radiation Oncology | Admitting: Radiation Oncology

## 2023-11-05 ENCOUNTER — Other Ambulatory Visit: Payer: Self-pay

## 2023-11-05 ENCOUNTER — Ambulatory Visit: Payer: Medicare Other

## 2023-11-05 DIAGNOSIS — Z510 Encounter for antineoplastic radiation therapy: Secondary | ICD-10-CM | POA: Diagnosis not present

## 2023-11-05 LAB — RAD ONC ARIA SESSION SUMMARY
Course Elapsed Days: 13
Plan Fractions Treated to Date: 10
Plan Prescribed Dose Per Fraction: 2.5 Gy
Plan Total Fractions Prescribed: 28
Plan Total Prescribed Dose: 70 Gy
Reference Point Dosage Given to Date: 25 Gy
Reference Point Session Dosage Given: 2.5 Gy
Session Number: 10

## 2023-11-06 ENCOUNTER — Other Ambulatory Visit: Payer: Self-pay

## 2023-11-06 ENCOUNTER — Ambulatory Visit
Admission: RE | Admit: 2023-11-06 | Discharge: 2023-11-06 | Discharge status: Home / Self Care | Payer: Medicare Other | Source: Ambulatory Visit | Attending: Radiation Oncology

## 2023-11-06 ENCOUNTER — Ambulatory Visit: Payer: Medicare Other

## 2023-11-06 ENCOUNTER — Ambulatory Visit
Admission: RE | Admit: 2023-11-06 | Discharge: 2023-11-06 | Disposition: A | Discharge status: Home / Self Care | Payer: Medicare Other | Source: Ambulatory Visit | Attending: Radiation Oncology

## 2023-11-06 DIAGNOSIS — Z510 Encounter for antineoplastic radiation therapy: Secondary | ICD-10-CM | POA: Diagnosis not present

## 2023-11-06 LAB — RAD ONC ARIA SESSION SUMMARY
Course Elapsed Days: 14
Plan Fractions Treated to Date: 11
Plan Prescribed Dose Per Fraction: 2.5 Gy
Plan Total Fractions Prescribed: 28
Plan Total Prescribed Dose: 70 Gy
Reference Point Dosage Given to Date: 27.5 Gy
Reference Point Session Dosage Given: 2.5 Gy
Session Number: 11

## 2023-11-07 ENCOUNTER — Other Ambulatory Visit: Payer: Self-pay

## 2023-11-07 ENCOUNTER — Ambulatory Visit: Payer: Medicare Other

## 2023-11-07 ENCOUNTER — Ambulatory Visit
Admission: RE | Admit: 2023-11-07 | Discharge: 2023-11-07 | Disposition: A | Discharge status: Home / Self Care | Payer: Medicare Other | Source: Ambulatory Visit | Attending: Radiation Oncology | Admitting: Radiation Oncology

## 2023-11-07 DIAGNOSIS — Z510 Encounter for antineoplastic radiation therapy: Secondary | ICD-10-CM | POA: Diagnosis not present

## 2023-11-07 LAB — RAD ONC ARIA SESSION SUMMARY
Course Elapsed Days: 15
Plan Fractions Treated to Date: 12
Plan Prescribed Dose Per Fraction: 2.5 Gy
Plan Total Fractions Prescribed: 28
Plan Total Prescribed Dose: 70 Gy
Reference Point Dosage Given to Date: 30 Gy
Reference Point Session Dosage Given: 2.5 Gy
Session Number: 12

## 2023-11-10 ENCOUNTER — Other Ambulatory Visit: Payer: Self-pay

## 2023-11-10 ENCOUNTER — Ambulatory Visit: Payer: Medicare Other

## 2023-11-10 ENCOUNTER — Ambulatory Visit
Admission: RE | Admit: 2023-11-10 | Discharge: 2023-11-10 | Disposition: A | Discharge status: Home / Self Care | Payer: Medicare Other | Source: Ambulatory Visit | Attending: Radiation Oncology

## 2023-11-10 DIAGNOSIS — Z510 Encounter for antineoplastic radiation therapy: Secondary | ICD-10-CM | POA: Diagnosis not present

## 2023-11-10 LAB — RAD ONC ARIA SESSION SUMMARY
Course Elapsed Days: 18
Plan Fractions Treated to Date: 13
Plan Prescribed Dose Per Fraction: 2.5 Gy
Plan Total Fractions Prescribed: 28
Plan Total Prescribed Dose: 70 Gy
Reference Point Dosage Given to Date: 32.5 Gy
Reference Point Session Dosage Given: 2.5 Gy
Session Number: 13

## 2023-11-11 ENCOUNTER — Ambulatory Visit: Payer: Medicare Other

## 2023-11-11 ENCOUNTER — Ambulatory Visit
Admission: RE | Admit: 2023-11-11 | Discharge: 2023-11-11 | Disposition: A | Discharge status: Home / Self Care | Payer: Medicare Other | Source: Ambulatory Visit | Attending: Radiation Oncology | Admitting: Radiation Oncology

## 2023-11-11 ENCOUNTER — Other Ambulatory Visit: Payer: Self-pay

## 2023-11-11 DIAGNOSIS — Z510 Encounter for antineoplastic radiation therapy: Secondary | ICD-10-CM | POA: Diagnosis not present

## 2023-11-11 LAB — RAD ONC ARIA SESSION SUMMARY
Course Elapsed Days: 19
Plan Fractions Treated to Date: 14
Plan Prescribed Dose Per Fraction: 2.5 Gy
Plan Total Fractions Prescribed: 28
Plan Total Prescribed Dose: 70 Gy
Reference Point Dosage Given to Date: 35 Gy
Reference Point Session Dosage Given: 2.5 Gy
Session Number: 14

## 2023-11-12 ENCOUNTER — Ambulatory Visit
Admission: RE | Admit: 2023-11-12 | Discharge: 2023-11-12 | Disposition: A | Discharge status: Home / Self Care | Payer: Medicare Other | Source: Ambulatory Visit | Attending: Radiation Oncology | Admitting: Radiation Oncology

## 2023-11-12 ENCOUNTER — Ambulatory Visit: Payer: Medicare Other

## 2023-11-12 ENCOUNTER — Other Ambulatory Visit: Payer: Self-pay

## 2023-11-12 DIAGNOSIS — Z510 Encounter for antineoplastic radiation therapy: Secondary | ICD-10-CM | POA: Diagnosis not present

## 2023-11-12 LAB — RAD ONC ARIA SESSION SUMMARY
Course Elapsed Days: 20
Plan Fractions Treated to Date: 15
Plan Prescribed Dose Per Fraction: 2.5 Gy
Plan Total Fractions Prescribed: 28
Plan Total Prescribed Dose: 70 Gy
Reference Point Dosage Given to Date: 37.5 Gy
Reference Point Session Dosage Given: 2.5 Gy
Session Number: 15

## 2023-11-13 ENCOUNTER — Ambulatory Visit
Admission: RE | Admit: 2023-11-13 | Discharge: 2023-11-13 | Disposition: A | Discharge status: Home / Self Care | Payer: Medicare Other | Source: Ambulatory Visit | Attending: Radiation Oncology | Admitting: Radiation Oncology

## 2023-11-13 ENCOUNTER — Ambulatory Visit: Payer: Medicare Other

## 2023-11-13 ENCOUNTER — Other Ambulatory Visit: Payer: Self-pay

## 2023-11-13 DIAGNOSIS — Z510 Encounter for antineoplastic radiation therapy: Secondary | ICD-10-CM | POA: Diagnosis not present

## 2023-11-13 LAB — RAD ONC ARIA SESSION SUMMARY
Course Elapsed Days: 21
Plan Fractions Treated to Date: 16
Plan Prescribed Dose Per Fraction: 2.5 Gy
Plan Total Fractions Prescribed: 28
Plan Total Prescribed Dose: 70 Gy
Reference Point Dosage Given to Date: 40 Gy
Reference Point Session Dosage Given: 2.5 Gy
Session Number: 16

## 2023-11-14 ENCOUNTER — Ambulatory Visit
Admission: RE | Admit: 2023-11-14 | Discharge: 2023-11-14 | Disposition: A | Discharge status: Home / Self Care | Payer: Medicare Other | Source: Ambulatory Visit | Attending: Radiation Oncology | Admitting: Radiation Oncology

## 2023-11-14 ENCOUNTER — Ambulatory Visit: Payer: Medicare Other

## 2023-11-14 ENCOUNTER — Other Ambulatory Visit: Payer: Self-pay

## 2023-11-14 DIAGNOSIS — Z510 Encounter for antineoplastic radiation therapy: Secondary | ICD-10-CM | POA: Diagnosis not present

## 2023-11-14 LAB — RAD ONC ARIA SESSION SUMMARY
Course Elapsed Days: 22
Plan Fractions Treated to Date: 17
Plan Prescribed Dose Per Fraction: 2.5 Gy
Plan Total Fractions Prescribed: 28
Plan Total Prescribed Dose: 70 Gy
Reference Point Dosage Given to Date: 42.5 Gy
Reference Point Session Dosage Given: 2.5 Gy
Session Number: 17

## 2023-11-17 ENCOUNTER — Other Ambulatory Visit: Payer: Self-pay

## 2023-11-17 ENCOUNTER — Ambulatory Visit
Admission: RE | Admit: 2023-11-17 | Discharge: 2023-11-17 | Disposition: A | Discharge status: Home / Self Care | Payer: Medicare Other | Source: Ambulatory Visit | Attending: Radiation Oncology

## 2023-11-17 DIAGNOSIS — Z510 Encounter for antineoplastic radiation therapy: Secondary | ICD-10-CM | POA: Diagnosis not present

## 2023-11-17 LAB — RAD ONC ARIA SESSION SUMMARY
Course Elapsed Days: 25
Plan Fractions Treated to Date: 18
Plan Prescribed Dose Per Fraction: 2.5 Gy
Plan Total Fractions Prescribed: 28
Plan Total Prescribed Dose: 70 Gy
Reference Point Dosage Given to Date: 45 Gy
Reference Point Session Dosage Given: 2.5 Gy
Session Number: 18

## 2023-11-18 ENCOUNTER — Ambulatory Visit
Admission: RE | Admit: 2023-11-18 | Discharge: 2023-11-18 | Disposition: A | Discharge status: Home / Self Care | Payer: Medicare Other | Source: Ambulatory Visit | Attending: Radiation Oncology

## 2023-11-18 ENCOUNTER — Other Ambulatory Visit: Payer: Self-pay

## 2023-11-18 DIAGNOSIS — Z510 Encounter for antineoplastic radiation therapy: Secondary | ICD-10-CM | POA: Diagnosis not present

## 2023-11-18 LAB — RAD ONC ARIA SESSION SUMMARY
Course Elapsed Days: 26
Plan Fractions Treated to Date: 19
Plan Prescribed Dose Per Fraction: 2.5 Gy
Plan Total Fractions Prescribed: 28
Plan Total Prescribed Dose: 70 Gy
Reference Point Dosage Given to Date: 47.5 Gy
Reference Point Session Dosage Given: 2.5 Gy
Session Number: 19

## 2023-11-19 ENCOUNTER — Other Ambulatory Visit: Payer: Self-pay

## 2023-11-19 ENCOUNTER — Ambulatory Visit
Admission: RE | Admit: 2023-11-19 | Discharge: 2023-11-19 | Disposition: A | Discharge status: Home / Self Care | Payer: Medicare Other | Source: Ambulatory Visit | Attending: Radiation Oncology | Admitting: Radiation Oncology

## 2023-11-19 DIAGNOSIS — Z510 Encounter for antineoplastic radiation therapy: Secondary | ICD-10-CM | POA: Diagnosis not present

## 2023-11-19 LAB — RAD ONC ARIA SESSION SUMMARY
Course Elapsed Days: 27
Plan Fractions Treated to Date: 20
Plan Prescribed Dose Per Fraction: 2.5 Gy
Plan Total Fractions Prescribed: 28
Plan Total Prescribed Dose: 70 Gy
Reference Point Dosage Given to Date: 50 Gy
Reference Point Session Dosage Given: 2.5 Gy
Session Number: 20

## 2023-11-20 ENCOUNTER — Other Ambulatory Visit: Payer: Self-pay

## 2023-11-20 ENCOUNTER — Ambulatory Visit
Admission: RE | Admit: 2023-11-20 | Discharge: 2023-11-20 | Disposition: A | Discharge status: Home / Self Care | Payer: Medicare Other | Source: Ambulatory Visit | Attending: Radiation Oncology

## 2023-11-20 DIAGNOSIS — Z510 Encounter for antineoplastic radiation therapy: Secondary | ICD-10-CM | POA: Diagnosis not present

## 2023-11-20 LAB — RAD ONC ARIA SESSION SUMMARY
Course Elapsed Days: 28
Plan Fractions Treated to Date: 21
Plan Prescribed Dose Per Fraction: 2.5 Gy
Plan Total Fractions Prescribed: 28
Plan Total Prescribed Dose: 70 Gy
Reference Point Dosage Given to Date: 52.5 Gy
Reference Point Session Dosage Given: 2.5 Gy
Session Number: 21

## 2023-11-21 ENCOUNTER — Other Ambulatory Visit: Payer: Self-pay

## 2023-11-21 ENCOUNTER — Ambulatory Visit
Admission: RE | Admit: 2023-11-21 | Discharge: 2023-11-21 | Disposition: A | Discharge status: Home / Self Care | Payer: Medicare Other | Source: Ambulatory Visit | Attending: Radiation Oncology | Admitting: Radiation Oncology

## 2023-11-21 DIAGNOSIS — Z510 Encounter for antineoplastic radiation therapy: Secondary | ICD-10-CM | POA: Diagnosis not present

## 2023-11-21 LAB — RAD ONC ARIA SESSION SUMMARY
Course Elapsed Days: 29
Plan Fractions Treated to Date: 22
Plan Prescribed Dose Per Fraction: 2.5 Gy
Plan Total Fractions Prescribed: 28
Plan Total Prescribed Dose: 70 Gy
Reference Point Dosage Given to Date: 55 Gy
Reference Point Session Dosage Given: 2.5 Gy
Session Number: 22

## 2023-11-24 ENCOUNTER — Ambulatory Visit
Admission: RE | Admit: 2023-11-24 | Discharge: 2023-11-24 | Disposition: A | Discharge status: Home / Self Care | Payer: Medicare Other | Source: Ambulatory Visit | Attending: Radiation Oncology | Admitting: Radiation Oncology

## 2023-11-24 ENCOUNTER — Other Ambulatory Visit: Payer: Self-pay

## 2023-11-24 DIAGNOSIS — Z510 Encounter for antineoplastic radiation therapy: Secondary | ICD-10-CM | POA: Diagnosis present

## 2023-11-24 DIAGNOSIS — C61 Malignant neoplasm of prostate: Secondary | ICD-10-CM | POA: Insufficient documentation

## 2023-11-24 LAB — RAD ONC ARIA SESSION SUMMARY
Course Elapsed Days: 32
Plan Fractions Treated to Date: 23
Plan Prescribed Dose Per Fraction: 2.5 Gy
Plan Total Fractions Prescribed: 28
Plan Total Prescribed Dose: 70 Gy
Reference Point Dosage Given to Date: 57.5 Gy
Reference Point Session Dosage Given: 2.5 Gy
Session Number: 23

## 2023-11-25 ENCOUNTER — Other Ambulatory Visit: Payer: Self-pay

## 2023-11-25 ENCOUNTER — Ambulatory Visit
Admission: RE | Admit: 2023-11-25 | Discharge: 2023-11-25 | Disposition: A | Discharge status: Home / Self Care | Payer: Medicare Other | Source: Ambulatory Visit | Attending: Radiation Oncology | Admitting: Radiation Oncology

## 2023-11-25 DIAGNOSIS — Z510 Encounter for antineoplastic radiation therapy: Secondary | ICD-10-CM | POA: Diagnosis not present

## 2023-11-25 LAB — RAD ONC ARIA SESSION SUMMARY
Course Elapsed Days: 33
Plan Fractions Treated to Date: 24
Plan Prescribed Dose Per Fraction: 2.5 Gy
Plan Total Fractions Prescribed: 28
Plan Total Prescribed Dose: 70 Gy
Reference Point Dosage Given to Date: 60 Gy
Reference Point Session Dosage Given: 2.5 Gy
Session Number: 24

## 2023-11-26 ENCOUNTER — Other Ambulatory Visit: Payer: Self-pay

## 2023-11-26 ENCOUNTER — Ambulatory Visit
Admission: RE | Admit: 2023-11-26 | Discharge: 2023-11-26 | Disposition: A | Discharge status: Home / Self Care | Payer: Medicare Other | Source: Ambulatory Visit | Attending: Radiation Oncology

## 2023-11-26 DIAGNOSIS — Z510 Encounter for antineoplastic radiation therapy: Secondary | ICD-10-CM | POA: Diagnosis not present

## 2023-11-26 LAB — RAD ONC ARIA SESSION SUMMARY
Course Elapsed Days: 34
Plan Fractions Treated to Date: 25
Plan Prescribed Dose Per Fraction: 2.5 Gy
Plan Total Fractions Prescribed: 28
Plan Total Prescribed Dose: 70 Gy
Reference Point Dosage Given to Date: 62.5 Gy
Reference Point Session Dosage Given: 2.5 Gy
Session Number: 25

## 2023-11-27 ENCOUNTER — Ambulatory Visit
Admission: RE | Admit: 2023-11-27 | Discharge: 2023-11-27 | Disposition: A | Discharge status: Home / Self Care | Payer: Medicare Other | Source: Ambulatory Visit | Attending: Radiation Oncology | Admitting: Radiation Oncology

## 2023-11-27 ENCOUNTER — Other Ambulatory Visit: Payer: Self-pay

## 2023-11-27 DIAGNOSIS — Z510 Encounter for antineoplastic radiation therapy: Secondary | ICD-10-CM | POA: Diagnosis not present

## 2023-11-27 LAB — RAD ONC ARIA SESSION SUMMARY
Course Elapsed Days: 35
Plan Fractions Treated to Date: 26
Plan Prescribed Dose Per Fraction: 2.5 Gy
Plan Total Fractions Prescribed: 28
Plan Total Prescribed Dose: 70 Gy
Reference Point Dosage Given to Date: 65 Gy
Reference Point Session Dosage Given: 2.5 Gy
Session Number: 26

## 2023-11-28 ENCOUNTER — Ambulatory Visit
Admission: RE | Admit: 2023-11-28 | Discharge: 2023-11-28 | Disposition: A | Discharge status: Home / Self Care | Payer: Medicare Other | Source: Ambulatory Visit | Attending: Radiation Oncology | Admitting: Radiation Oncology

## 2023-11-28 ENCOUNTER — Other Ambulatory Visit: Payer: Self-pay

## 2023-11-28 DIAGNOSIS — Z510 Encounter for antineoplastic radiation therapy: Secondary | ICD-10-CM | POA: Diagnosis not present

## 2023-11-28 LAB — RAD ONC ARIA SESSION SUMMARY
Course Elapsed Days: 36
Plan Fractions Treated to Date: 27
Plan Prescribed Dose Per Fraction: 2.5 Gy
Plan Total Fractions Prescribed: 28
Plan Total Prescribed Dose: 70 Gy
Reference Point Dosage Given to Date: 67.5 Gy
Reference Point Session Dosage Given: 2.5 Gy
Session Number: 27

## 2023-12-01 ENCOUNTER — Ambulatory Visit
Admission: RE | Admit: 2023-12-01 | Discharge: 2023-12-01 | Disposition: A | Discharge status: Home / Self Care | Payer: Medicare Other | Source: Ambulatory Visit | Attending: Radiation Oncology | Admitting: Radiation Oncology

## 2023-12-01 ENCOUNTER — Other Ambulatory Visit: Payer: Self-pay

## 2023-12-01 DIAGNOSIS — Z510 Encounter for antineoplastic radiation therapy: Secondary | ICD-10-CM | POA: Diagnosis not present

## 2023-12-01 DIAGNOSIS — C61 Malignant neoplasm of prostate: Secondary | ICD-10-CM

## 2023-12-01 LAB — RAD ONC ARIA SESSION SUMMARY
Course Elapsed Days: 39
Plan Fractions Treated to Date: 28
Plan Prescribed Dose Per Fraction: 2.5 Gy
Plan Total Fractions Prescribed: 28
Plan Total Prescribed Dose: 70 Gy
Reference Point Dosage Given to Date: 70 Gy
Reference Point Session Dosage Given: 2.5 Gy
Session Number: 28

## 2023-12-02 NOTE — Radiation Completion Notes (Addendum)
  Radiation Oncology         [phone] ________________________________  Name: Christian Mclaughlin MRN: [id number]  Date: 12/01/2023  DOB: 1945-10-27  Referring Physician: Jettie Pagan, M.D. Date of Service: 2023-12-02 Radiation Oncologist: Margaretmary Bayley, M.D. [facility]     RADIATION ONCOLOGY END OF TREATMENT NOTE     Diagnosis: C61 Malignant neoplasm of prostate Staging on 2023-05-13: Malignant neoplasm of prostate (HCC) T=cT1c, N=cN0, M=cM0 Intent: Curative     ==========DELIVERED PLANS==========  First Treatment Date: 2023-10-23 Last Treatment Date: 2023-12-01   Plan Name: Prostate Site: Prostate Technique: IMRT Mode: Photon Dose Per Fraction: 2.5 Gy Prescribed Dose (Delivered / Prescribed): 70 Gy / 70 Gy Prescribed Fxs (Delivered / Prescribed): 28 / 28     ==========ON TREATMENT VISIT DATES========== 2023-10-24, 2023-10-31, 2023-11-06, 2023-11-14, 2023-11-21, 2023-11-28   See weekly On Treatment Notes in Epic for details in the Media tab (listed as Progress notes on the On Treatment Visit Dates listed above).  He tolerated the radiation treatments relatively well with mild increased LUTS and modest fatigue.  The patient will receive a call in about one month from the radiation oncology department. He will continue follow up with his urologist, Dr. Cardell Peach, as well.  ------------------------------------------------   Margaretmary Dys, MD [facility]  Radiation Oncology Direct Dial: [phone]  Fax: [phone] [email or web address]  Skype  LinkedIn

## 2023-12-09 NOTE — Progress Notes (Signed)
 Patient was a RadOnc Consult on 06/10/23 for his stage T1c adenocarcinoma of the prostate with Gleason score of 3+4, and PSA of 5 (adjusted for finasteride). Patient proceed with treatment recommendations of 5.5 weeks of daily external beam (TURP prior) and had his final radiation treatment on 12/01/23.   Patient is scheduled for a post treatment nurse call on 12/30/23 and has his first post treatment PSA on 12/29/23 at [facility].

## 2023-12-30 ENCOUNTER — Ambulatory Visit
Admission: RE | Admit: 2023-12-30 | Discharge: 2023-12-30 | Disposition: A | Discharge status: Home / Self Care | Source: Ambulatory Visit | Attending: Pain Medicine | Admitting: Pain Medicine

## 2023-12-30 NOTE — Progress Notes (Signed)
  Radiation Oncology         [phone] ________________________________  Name: Christian Mclaughlin MRN: [id number]  Date of Service: 12/30/2023  DOB: [birth year]  Post Treatment Telephone Note  Diagnosis:  C61 Malignant neoplasm of prostate (as documented in provider EOT note)  Pre Treatment IPSS Score: 22 (as documented in the provider consult note)  The patient was available for call today.   Symptoms of fatigue have not improved since completing therapy.  Symptoms of bladder changes have improved since completing therapy. Current symptoms include, and medications for bladder symptoms include none.  Symptoms of bowel changes have improved since completing therapy. Current symptoms include none, and medications for bowel symptoms include none.   Post Treatment IPSS Score: IPSS Questionnaire (AUA-7): Over the past month.   1)  How often have you had a sensation of not emptying your bladder completely after you finish urinating?  0 - Not at all  2)  How often have you had to urinate again less than two hours after you finished urinating? 1 - Less than 1 time in 5  3)  How often have you found you stopped and started again several times when you urinated?  0 - Not at all  4) How difficult have you found it to postpone urination?  1 - Less than 1 time in 5  5) How often have you had a weak urinary stream?  1 - Less than 1 time in 5  6) How often have you had to push or strain to begin urination?  0 - Not at all  7) How many times did you most typically get up to urinate from the time you went to bed until the time you got up in the morning?  1 - 1 time  Total score:  4. Which indicates mild symptoms  0-7 mildly symptomatic   8-19 moderately symptomatic   20-35 severely symptomatic   Patient has a scheduled follow up visit with his urologist, Dr. Cardell Peach, 06/2024 for ongoing surveillance. He was counseled that PSA levels will be drawn in the urology office, and was reassured that additional  time is expected to improve bowel and bladder symptoms. He was encouraged to call back with concerns or questions regarding radiation.  This concludes the interaction.  Ruel Favors, LPN

## 2024-01-02 ENCOUNTER — Other Ambulatory Visit: Payer: Self-pay | Admitting: Urology

## 2024-01-02 DIAGNOSIS — C61 Malignant neoplasm of prostate: Secondary | ICD-10-CM

## 2024-01-06 ENCOUNTER — Encounter: Payer: Self-pay | Admitting: *Deleted

## 2024-01-30 ENCOUNTER — Encounter: Payer: Self-pay | Admitting: *Deleted

## 2024-01-30 ENCOUNTER — Inpatient Hospital Stay: Attending: Adult Health | Admitting: *Deleted

## 2024-01-30 DIAGNOSIS — C61 Malignant neoplasm of prostate: Secondary | ICD-10-CM

## 2024-01-30 NOTE — Progress Notes (Signed)
SCP reviewed and completed. 

## 2024-09-11 ENCOUNTER — Emergency Department (HOSPITAL_COMMUNITY)

## 2024-09-11 ENCOUNTER — Emergency Department (HOSPITAL_COMMUNITY)
Admission: EM | Admit: 2024-09-11 | Discharge: 2024-09-11 | Disposition: A | Discharge status: Home / Self Care | Attending: Emergency Medicine | Admitting: Emergency Medicine

## 2024-09-11 ENCOUNTER — Encounter (HOSPITAL_COMMUNITY): Payer: Self-pay | Admitting: Emergency Medicine

## 2024-09-11 ENCOUNTER — Other Ambulatory Visit: Payer: Self-pay

## 2024-09-11 DIAGNOSIS — Z8673 Personal history of transient ischemic attack (TIA), and cerebral infarction without residual deficits: Secondary | ICD-10-CM | POA: Diagnosis not present

## 2024-09-11 DIAGNOSIS — W06XXXA Fall from bed, initial encounter: Secondary | ICD-10-CM | POA: Insufficient documentation

## 2024-09-11 DIAGNOSIS — D72819 Decreased white blood cell count, unspecified: Secondary | ICD-10-CM | POA: Diagnosis not present

## 2024-09-11 DIAGNOSIS — R569 Unspecified convulsions: Secondary | ICD-10-CM | POA: Insufficient documentation

## 2024-09-11 DIAGNOSIS — Z7902 Long term (current) use of antithrombotics/antiplatelets: Secondary | ICD-10-CM | POA: Insufficient documentation

## 2024-09-11 DIAGNOSIS — Z79899 Other long term (current) drug therapy: Secondary | ICD-10-CM | POA: Insufficient documentation

## 2024-09-11 DIAGNOSIS — Z7982 Long term (current) use of aspirin: Secondary | ICD-10-CM | POA: Diagnosis not present

## 2024-09-11 LAB — COMPREHENSIVE METABOLIC PANEL WITH GFR
ALT: 16 U/L (ref 0–44)
AST: 24 U/L (ref 15–41)
Albumin: 4.1 g/dL (ref 3.5–5.0)
Alkaline Phosphatase: 78 U/L (ref 38–126)
Anion gap: 17 — ABNORMAL HIGH (ref 5–15)
BUN: 17 mg/dL (ref 8–23)
CO2: 18 mmol/L — ABNORMAL LOW (ref 22–32)
Calcium: 9.5 mg/dL (ref 8.9–10.3)
Chloride: 102 mmol/L (ref 98–111)
Creatinine, Ser: 1.39 mg/dL — ABNORMAL HIGH (ref 0.61–1.24)
GFR, Estimated: 53 mL/min — ABNORMAL LOW
Glucose, Bld: 228 mg/dL — ABNORMAL HIGH (ref 70–99)
Potassium: 3.9 mmol/L (ref 3.5–5.1)
Sodium: 137 mmol/L (ref 135–145)
Total Bilirubin: 0.6 mg/dL (ref 0.0–1.2)
Total Protein: 7.6 g/dL (ref 6.5–8.1)

## 2024-09-11 LAB — URINALYSIS, MICROSCOPIC (REFLEX)

## 2024-09-11 LAB — CBC WITH DIFFERENTIAL/PLATELET
Abs Immature Granulocytes: 0.02 K/uL (ref 0.00–0.07)
Basophils Absolute: 0 K/uL (ref 0.0–0.1)
Basophils Relative: 1 %
Eosinophils Absolute: 0 K/uL (ref 0.0–0.5)
Eosinophils Relative: 1 %
HCT: 42.6 % (ref 39.0–52.0)
Hemoglobin: 13.8 g/dL (ref 13.0–17.0)
Immature Granulocytes: 1 %
Lymphocytes Relative: 40 %
Lymphs Abs: 1.4 K/uL (ref 0.7–4.0)
MCH: 26.9 pg (ref 26.0–34.0)
MCHC: 32.4 g/dL (ref 30.0–36.0)
MCV: 83 fL (ref 80.0–100.0)
Monocytes Absolute: 0.3 K/uL (ref 0.1–1.0)
Monocytes Relative: 8 %
Neutro Abs: 1.7 K/uL (ref 1.7–7.7)
Neutrophils Relative %: 49 %
Platelets: 214 K/uL (ref 150–400)
RBC: 5.13 MIL/uL (ref 4.22–5.81)
RDW: 14.9 % (ref 11.5–15.5)
WBC: 3.4 K/uL — ABNORMAL LOW (ref 4.0–10.5)
nRBC: 0 % (ref 0.0–0.2)

## 2024-09-11 LAB — URINE DRUG SCREEN
Amphetamines: NEGATIVE
Barbiturates: NEGATIVE
Benzodiazepines: NEGATIVE
Cocaine: NEGATIVE
Fentanyl: NEGATIVE
Methadone Scn, Ur: NEGATIVE
Opiates: NEGATIVE
Tetrahydrocannabinol: NEGATIVE

## 2024-09-11 LAB — URINALYSIS, ROUTINE W REFLEX MICROSCOPIC
Bilirubin Urine: NEGATIVE
Glucose, UA: >=500 mg/dL — AB
Ketones, ur: NEGATIVE mg/dL
Leukocytes,Ua: NEGATIVE
Nitrite: NEGATIVE
Protein, ur: 100 mg/dL — AB
Specific Gravity, Urine: 1.025 (ref 1.005–1.030)
pH: 6 (ref 5.0–8.0)

## 2024-09-11 LAB — PROTIME-INR
INR: 0.9 (ref 0.8–1.2)
Prothrombin Time: 13 s (ref 11.4–15.2)

## 2024-09-11 LAB — CBG MONITORING, ED: Glucose-Capillary: 238 mg/dL — ABNORMAL HIGH (ref 70–99)

## 2024-09-11 LAB — MAGNESIUM: Magnesium: 2.2 mg/dL (ref 1.7–2.4)

## 2024-09-11 MED ORDER — ONDANSETRON HCL 4 MG/2ML IJ SOLN
4.0000 mg | Freq: Once | INTRAMUSCULAR | Status: AC
  Administered 2024-09-11: 4 mg via INTRAVENOUS
  Filled 2024-09-11: qty 2

## 2024-09-11 MED ORDER — SODIUM CHLORIDE 0.9 % IV BOLUS
1000.0000 mL | Freq: Once | INTRAVENOUS | Status: AC
  Administered 2024-09-11: 1000 mL via INTRAVENOUS

## 2024-09-11 MED ORDER — LORAZEPAM 2 MG/ML IJ SOLN: 4.0000 mg | INTRAMUSCULAR | Status: DC | PRN

## 2024-09-11 MED ORDER — MORPHINE SULFATE (PF) 4 MG/ML IV SOLN
4.0000 mg | Freq: Once | INTRAVENOUS | Status: AC
  Administered 2024-09-11: 4 mg via INTRAVENOUS
  Filled 2024-09-11: qty 1

## 2024-09-11 MED ORDER — FENTANYL CITRATE (PF) 50 MCG/ML IJ SOSY
25.0000 ug | PREFILLED_SYRINGE | Freq: Once | INTRAMUSCULAR | Status: AC
  Administered 2024-09-11: 25 ug via INTRAVENOUS
  Filled 2024-09-11: qty 1

## 2024-09-11 NOTE — ED Notes (Signed)
 Patient transported to MRI

## 2024-09-11 NOTE — Discharge Instructions (Signed)
 Evaluation today was overall reassuring.  MRI showed no acute changes. Please follow-up with neurology.  If you have a seizure, weakness and or numbness in your extremities, issues with speech or visual changes or any other concerning symptom please return to the ED for further evaluation.  I submitted a referral to [facility] neurology.

## 2024-09-11 NOTE — ED Triage Notes (Signed)
 Patient arrives via [facility] from home for witness tonic clonic seizure lasting approx 3 minutes. During seizure patient rolled off bed and hit head on ground. On plavix . No history of seizures. Post ictal on arrival.   BP 148/78, P 998, SPO2 97%, CBG 205

## 2024-09-11 NOTE — ED Notes (Signed)
 Pt transported to MRI

## 2024-09-11 NOTE — ED Provider Notes (Signed)
 Patient signed out to me by PA Pleasant Eng.  Here for seizure.  No prior history.  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  MRI is pending.  If unremarkable plan is neurology follow-up for likely outpatient EEG.  Reconsult neuro if there is an acute abnormality on the MRI. Physical Exam  BP (!) 166/88 (BP Location: Right Arm)   Pulse 70   Temp 98.3 F (36.8 C) (Oral)   Resp 16   Ht 5' 9 (1.753 m)   Wt 84.4 kg   SpO2 98%   BMI 27.47 kg/m   Physical Exam  Procedures  Procedures  ED Course / MDM   Clinical Course as of 09/11/24 1222  Sat Sep 11, 2024  0624 Consult from neurology, Dr. Vanessa. NO clear etiology such as precipitating medication or electrolyte derangement/ hypoglycemia, therefore He recommends MR brain noncon, if unremarkable, outpatient routine EEG. If acute foci, reconsult neuro.  [RS]  0631 H/o stroke in 2019. Here for seizure. First. Post ictal on arrival. Discussed with Sal of Neuro. If MRI is normal, can dc to neuro follow up. If abnormal, reconsult neuro.  [JR]    Clinical Course User Index [JR] Dany Harten K, PA-C [RS] Sponseller, Pleasant SAUNDERS, PA-C   Medical Decision Making Amount and/or Complexity of Data Reviewed Labs: ordered. Radiology: ordered.  Risk Prescription drug management.   MRI showed no acute abnormalities.  Chronic findings around the left pons.  Patient remained well with no seizure activity here.  Discussed preventative measures for seizures at home primarily no driving for 6 months.  Advised neuro follow-up.  Submitted neurology referral.  Discussed return precautions.  Discharged good condition.       Lang Norleen POUR, PA-C 09/11/24 1226    Randol Simmonds, MD 09/11/24 2006

## 2024-09-11 NOTE — ED Notes (Signed)
 CCMD notified of order for cardiac monitoring.

## 2024-09-11 NOTE — ED Notes (Signed)
 RN changed pts diaper at this time.

## 2024-09-11 NOTE — ED Provider Notes (Signed)
 " Christian Mclaughlin Provider Note   CSN: [id number] Arrival date & time: 09/11/24  9581     Patient presents with: Seizures   Christian Mclaughlin is a 78 y.o. male who presents via EMS after reported seizure-like activity at home.  Patient's wife provides collateral history as the patient is amnestic to the event.  She states that she woke up to him making loud groaning noise before rolling over and falling out of bed striking his face on the floor.  She states that at that time he began to have full body shaking and was foaming at the mouth.  She states this lasted for which she estimates to be about 10 minutes until EMS arrived and administered medication to help him stop experiencing the apparent seizure-like activity.  Patient states he was very warm over any of these events, only remembers coming to in the ambulance and route to the emergency department.  No history of seizures in the past. Level 5 caveat due to acuity of presentatio upon arrival.   Hx of pontine infarct in 2019---> ASA + plavix .  Patient reports only new symptom is 1 week of paresthesias in the left upper extremity without weakness.   HPI     Prior to Admission medications  Medication Sig Start Date End Date Taking? Authorizing Provider  amLODipine  (NORVASC ) 10 MG tablet Take 10 mg by mouth daily. 04/25/18   [provider]  atorvastatin  (LIPITOR) 40 MG tablet Take 1 tablet (40 mg total) by mouth daily at 6 PM. Patient taking differently: Take 40 mg by mouth at bedtime. 06/02/18   Jadine Toribio SQUIBB, MD  Calcium  Polycarbophil (FIBER) 625 MG TABS Take 2 tablets by mouth daily. 06/18/22   [provider]  Cholecalciferol 125 MCG (5000 UT) capsule Take 125 mcg by mouth daily. 11/11/23   [provider]  clopidogrel  (PLAVIX ) 75 MG tablet Take 1 tablet (75 mg total) by mouth daily. Patient taking differently: Take 75 mg by mouth every evening. 06/03/18   Jadine Toribio SQUIBB, MD  ferrous sulfate  325 (65 FE) MG EC tablet Take 325 mg by mouth daily. 06/06/23   [provider]  finasteride  (PROSCAR ) 5 MG tablet Take 5 mg by mouth daily.    [provider]  insulin  glargine-yfgn (SEMGLEE , YFGN,) 100 UNIT/ML Pen Inject 8 Units into the skin at bedtime. [facility] substitute for Lantus     [provider]  metoprolol  succinate (TOPROL -XL) 50 MG 24 hr tablet Take 25 mg by mouth daily. Takes half tablet  daily 06/06/23   [provider]  Polyethyl Glycol-Propyl Glycol (SYSTANE OP) Place 1 drop into both eyes as needed (dry eye). Patient not taking: Reported on 01/30/2024    [provider]  PRESCRIPTION MEDICATION Take 4 sprays by mouth 4 (four) times daily as needed. ARTIFICIAL SALIVA ORAL SPRAY  ([facility]S FROM [facility])    [provider]  Semaglutide, 1 MG/DOSE, 4 MG/3ML SOPN Inject 1 mg into the skin once a week. Wednesday's  (ozempic) 05/15/23   [provider]    Allergies: Patient has no known allergies.    Review of Systems  Unable to perform ROS: Mental status change  Neurological:  Positive for seizures.    Updated Vital Signs BP (!) 173/96   Pulse (!) 55   Temp 98.1 F (36.7 C) (Oral)   Resp 15   Ht 5' 9 (1.753 m)   Wt 84.4 kg   SpO2  96%   BMI 27.47 kg/m   Physical Exam Vitals and nursing note reviewed.  Constitutional:      Appearance: He is not ill-appearing or toxic-appearing.  HENT:     Head: Normocephalic and atraumatic.     Comments: Tongue swollen with lacerations presumably sustained during surgery.    Mouth/Throat:     Mouth: Mucous membranes are moist.     Pharynx: No oropharyngeal exudate or posterior oropharyngeal erythema.  Eyes:     General:        Right eye: No discharge.        Left eye: No discharge.     Conjunctiva/sclera: Conjunctivae normal.  Cardiovascular:     Rate and Rhythm: Normal rate and regular rhythm.     Pulses: Normal pulses.     Heart sounds:  Normal heart sounds.  Pulmonary:     Effort: Pulmonary effort is normal. No respiratory distress.     Breath sounds: Normal breath sounds. No wheezing or rales.  Abdominal:     General: Bowel sounds are normal. There is no distension.     Tenderness: There is no abdominal tenderness.  Musculoskeletal:        General: No deformity.     Cervical back: Neck supple.       Legs:     Comments: Decreased ROM of the left shoulder secondary to pain and TTP of the LUE.   Skin:    General: Skin is warm and dry.     Capillary Refill: Capillary refill takes less than 2 seconds.  Neurological:     General: No focal deficit present.     Mental Status: He is alert and oriented to person, place, and time. Mental status is at baseline.  Psychiatric:        Mood and Affect: Mood normal.     (all labs ordered are listed, but only abnormal results are displayed) Labs Reviewed  COMPREHENSIVE METABOLIC PANEL WITH GFR - Abnormal; Notable for the following components:      Result Value   CO2 18 (*)    Glucose, Bld 228 (*)    Creatinine, Ser 1.39 (*)    GFR, Estimated 53 (*)    Anion gap 17 (*)    All other components within normal limits  CBC WITH DIFFERENTIAL/PLATELET - Abnormal; Notable for the following components:   WBC 3.4 (*)    All other components within normal limits  CBG MONITORING, ED - Abnormal; Notable for the following components:   Glucose-Capillary 238 (*)    All other components within normal limits  MAGNESIUM  PROTIME-INR  URINALYSIS, ROUTINE W REFLEX MICROSCOPIC  URINE DRUG SCREEN  CBG MONITORING, ED    EKG: None  Radiology: CT CERVICAL SPINE WO CONTRAST Result Date: 09/11/2024 EXAM: CT CERVICAL SPINE WITHOUT CONTRAST 09/11/2024 05:10:14 AM TECHNIQUE: CT of the cervical spine was performed without the administration of intravenous contrast. Multiplanar reformatted images are provided for review. Automated exposure control, iterative reconstruction, and/or weight based  adjustment of the mA/kV was utilized to reduce the radiation dose to as low as reasonably achievable. COMPARISON: None available. CLINICAL HISTORY: Neck trauma (Age >= 65y) FINDINGS: BONES AND ALIGNMENT: No acute fracture or traumatic malalignment. DEGENERATIVE CHANGES: There is mild diffuse chronic degenerative disc disease throughout the cervical spine. There is moderate bilateral neural foraminal stenosis at C5-C6 secondary to bilateral uncovertebral joint hypertrophy. SOFT TISSUES: No prevertebral soft tissue swelling. There is calcific atheromatous disease within the carotid bulbs bilaterally. IMPRESSION: 1. No  evidence of acute traumatic injury. 2. Mild diffuse chronic degenerative disc disease throughout the cervical spine. 3. Moderate bilateral neural foraminal stenosis at C5-6 secondary to bilateral uncovertebral joint hypertrophy. 4. Calcific atheromatous disease within the carotid bulbs bilaterally. Electronically signed by: Evalene Coho MD 09/11/2024 05:19 AM EST RP Workstation: [id number]   CT HEAD WO CONTRAST Result Date: 09/11/2024 EXAM: CT HEAD WITHOUT CONTRAST 09/11/2024 05:10:14 AM TECHNIQUE: CT of the head was performed without the administration of intravenous contrast. Automated exposure control, iterative reconstruction, and/or weight based adjustment of the mA/kV was utilized to reduce the radiation dose to as low as reasonably achievable. COMPARISON: CT angiogram of the head dated 06/01/2018. CLINICAL HISTORY: Seizure, new-onset, no history of trauma. FINDINGS: BRAIN AND VENTRICLES: No acute hemorrhage. No evidence of acute infarct. No hydrocephalus. No extra-axial collection. No mass effect or midline shift. Punctate calcification in left basal ganglia. There are vascular calcifications within the carotid siphons. ORBITS: No acute abnormality. SINUSES: No acute abnormality. SOFT TISSUES AND SKULL: No acute soft tissue abnormality. No skull fracture. IMPRESSION: 1. No acute  intracranial abnormality to explain the new-onset seizure. 2. Vascular calcifications within the carotid siphons and punctate calcification in the left basal ganglia. Electronically signed by: Evalene Coho MD 09/11/2024 05:17 AM EST RP Workstation: [id number]     Procedures   Medications Ordered in the ED  LORazepam  (ATIVAN ) injection 4 mg (has no administration in time range)  sodium chloride  0.9 % bolus 1,000 mL (has no administration in time range)  fentaNYL  (SUBLIMAZE ) injection 25 mcg (has no administration in time range)    Clinical Course as of 09/11/24 0633  Sat Sep 11, 2024  0624 Consult from neurology, Dr. Vanessa. NO clear etiology such as precipitating medication or electrolyte derangement/ hypoglycemia, therefore He recommends MR brain noncon, if unremarkable, outpatient routine EEG. If acute foci, reconsult neuro.  [RS]  0631 H/o stroke in 2019. Here for seizure. First. Post ictal on arrival. Discussed with Sal of Neuro. If MRI is normal, can dc to neuro follow up. If abnormal, reconsult neuro.  [JR]    Clinical Course User Index [JR] Robinson, John K, PA-C [RS] Jeshurun Oaxaca, Pleasant SAUNDERS, PA-C                                 Medical Decision Making 78 year old male presents with concern for first-time seizure at home today, witnessed by wife.  Hypertensive on intake, vitals otherwise normal.  Cardiopulmonary exam is unremarkable, abdominal exam is benign.  Neurologic exam is nonfocal.  Pain over the left upper extremity and right anterior shin.  Diagnosis includes but is not limited to electrolyte derangement, glycemic crisis, trauma, intracranial hemorrhage or ischemic CVA, space-occupying lesion.  Amount and/or Complexity of Data Reviewed Labs: ordered.    Details: CBC without leukocytosis, mild leukopenia of 3.4.  CMP with creatinine of 1.39 near patient's baseline.  Low CO2 to 18 elevated with a gap of 17.  CBG 238, mag is normal.'  INR is normal.   Radiology:  ordered.    Details: CT head and C-spine are reassuring.  Risk Prescription drug management.   Care of this patient signed out to oncoming ED provider DOROTHA Essex, PA-C at time of shift change. Pending of completion of imaging.  All pertinent HPI, physical exam, and laboratory findings were discussed with them prior to my departure. Disposition of patient pending completion of workup, reevaluation, and clinical judgement of oncoming ED provider.  Olof and his family  voiced understanding of her medical evaluation and treatment plan. Each of their questions answered to their expressed satisfaction.   This chart was dictated using voice recognition software, Dragon. Despite the best efforts of this provider to proofread and correct errors, errors may still occur which can change documentation meaning.       Final diagnoses:  None    ED Discharge Orders     None          Bobette Pleasant JONELLE DEVONNA 09/11/24 9366    Trine Raynell Moder, MD 09/11/24 [phone]  "

## 2024-09-17 NOTE — Progress Notes (Signed)
 "  NEUROLOGY CONSULTATION NOTE  Christian Mclaughlin MRN: [id number] DOB: March 01, 1946  Referring provider: Norleen Essex, PA-C Primary care provider: Jodie Marek, DO  Reason for consult:  seizure  Assessment/Plan:   New-onset seizure - unclear etiology.  While alcohol withdrawal seizures usually occur within 24 to 48 hours, it is possible to occur a week after cessation, although much less common.    Hypertension    Check 1 hour EEG Discussed  law stating no driving for 6 months from last seizure. Avoid high locations or bathing alone Continue to abstain from alcohol As this is an isolated seizure, defer starting an AED unless EEG is abnormal or he has a second seizure. Follow up with PCP regarding blood pressure. Follow up 4 months.   Subjective:   Discussed the use of AI scribe software for clinical note transcription with the patient, who gave verbal consent to proceed.  History of Present Illness Christian Mclaughlin is a 78 year old right-handed male with CKD, DM 2, HTN, HLD, OSA, prostate cancer and history of left pontine stroke who presents for seizure.  History supplemented by ED note.  He experienced a seizure episode on the night of September 11, 2024. He went to bed around 7 PM and woke up in the emergency room around 3:15 AM, with no recollection of the events leading to his hospitalization. His wife reported that he made loud groaning noises, rolled out of bed, struck his face and leg, and experienced full body shaking with foaming at the mouth. The episode lasted about ten minutes until EMS arrived and administered medication. He recalls vague memories of passing lights from stores while being transported to the hospital.  He did bite his tongue.  In the ED, CT head showed no acute process.  Follow up MRI of brain without contrast showed chronic lacunar infarct within the left paracentral pons but otherwise no acute intracranial abnormality.  Labs were largely unremarkable  except for glucose 228, CO2 18 and near baseline Cr 1.39 but otherwise no significant electrolyte imbalance and without evidence of infection.  EKG showed no acute abnormalities.  No history of seizures, significant head trauma, meningitis, or new medication use prior to the event. He has been experiencing soreness, particularly in his shoulders, and difficulty reaching with his arms since the seizure. He also bit his tongue during the episode.  Family history is notable for a brother who died from seizures at the age of 29. The brother reportedly did not have seizures as a child.  Social history reveals alcohol consumption, previously drinking about half a gallon of [place] per week, but had abstained for a week prior to the seizure. He denies illicit drug use and has a history of chronic kidney disease. He is currently taking meloxicam for shoulder pain, which he takes every morning. His wife speculated that he might have taken his medication twice, but he is unsure about this.      PAST MEDICAL HISTORY: Past Medical History:  Diagnosis Date   BPH with obstruction/lower urinary tract symptoms    CKD (chronic kidney disease), stage III (HCC)    DDD (degenerative disc disease), cervical    ED (erectile dysfunction)    History of CVA (cerebrovascular accident) without residual deficits 05/31/2018   followed by pcp at [facility];   admission in epic;   left MCA pontine paramedian infarct along with remote punctate lacunar infart left inferanl capsule secondary to small vessel disease;  no residual   Hyperlipidemia  Hypertension    Malignant neoplasm prostate [facility]) 04/2023   urologist-- dr gay/  radiation oncologist--- dr patrcia;  dx 08/ 2024, gleason 3+4,  psa 5,  vol  113 cc   OSA on CPAP    followed by  [facility]   (08-14-2023  pt stated uses 5 out 7 days per week)   Type 2 diabetes mellitus treated with insulin  (HCC)    [facility];    (08-14-2023  pt stated check blood sugar multiple times  daily w/ LIbre 3,  fasting average 100-140)   Urge urinary incontinence    Wears glasses    Wears partial dentures    upper and lower    PAST SURGICAL HISTORY: Past Surgical History:  Procedure Laterality Date   COLONOSCOPY  2015   approx   GOLD SEED IMPLANT N/A 08/25/2023   Procedure: GOLD SEED IMPLANT;  Surgeon: Selma Donnice SAUNDERS, MD;  Location: [facility];  Service: Urology;  Laterality: N/A;   SOFT TISSUE MASS EXCISION Left    1970s;   left breast area,  pt stated benign   SPACE OAR INSTILLATION N/A 08/25/2023   Procedure: SPACE OAR INSTILLATION;  Surgeon: Selma Donnice SAUNDERS, MD;  Location: [facility];  Service: Urology;  Laterality: N/A;   TRANSURETHRAL RESECTION OF PROSTATE N/A 08/25/2023   Procedure: BIPOLAR TRANSURETHRAL RESECTION OF THE PROSTATE (TURP);  Surgeon: Selma Donnice SAUNDERS, MD;  Location: [facility];  Service: Urology;  Laterality: N/A;  90 MINUTES NEEDED FOR CASE    MEDICATIONS: Medications Ordered Prior to Encounter[1]  ALLERGIES: Allergies[2]  FAMILY HISTORY: No family history on file.  Objective:  Blood pressure (!) 187/98, pulse 69, height 5' 9 (1.753 m), weight 189 lb (85.7 kg), SpO2 98%. General: No acute distress.  Patient appears well-groomed.   Head:  Normocephalic/atraumatic Eyes:  fundi examined but not visualized Neck: supple, no paraspinal tenderness, full range of motion Heart: regular rate and rhythm Neurological Exam: Mental status: alert and oriented to person, place, and time, speech fluent and not dysarthric, language intact. Cranial nerves: CN I: not tested CN II: pupils equal, round and reactive to light, visual fields intact CN III, IV, VI:  full range of motion, no nystagmus, no ptosis CN V: facial sensation intact. CN VII: upper and lower face symmetric CN VIII: hearing intact CN IX, X: gag intact, uvula midline CN XI: sternocleidomastoid and trapezius muscles intact CN XII: tongue  midline Bulk & Tone: normal, no fasciculations. Motor:  muscle strength 5/5 throughout Sensation:  Pinprick and vibratory sensation intact. Deep Tendon Reflexes:  2+ throughout,  toes downgoing.   Finger to nose testing:  Without dysmetria.   Gait:  Normal station and stride.  Romberg negative.    Thank you for allowing me to take part in the care of this patient.  Juliene Dunnings, DO        [1]  Current Outpatient Medications on File Prior to Visit  Medication Sig Dispense Refill   amLODipine  (NORVASC ) 10 MG tablet Take 10 mg by mouth daily.     atorvastatin  (LIPITOR) 40 MG tablet Take 1 tablet (40 mg total) by mouth daily at 6 PM. (Patient taking differently: Take 40 mg by mouth at bedtime.) 30 tablet 0   Calcium  Polycarbophil (FIBER) 625 MG TABS Take 2 tablets by mouth daily.     Cholecalciferol 125 MCG (5000 UT) capsule Take 125 mcg by mouth daily.     clopidogrel  (PLAVIX ) 75 MG tablet  Take 1 tablet (75 mg total) by mouth daily. (Patient taking differently: Take 75 mg by mouth every evening.) 30 tablet 0   ferrous sulfate  325 (65 FE) MG EC tablet Take 325 mg by mouth daily.     finasteride  (PROSCAR ) 5 MG tablet Take 5 mg by mouth daily.     insulin  glargine-yfgn (SEMGLEE , YFGN,) 100 UNIT/ML Pen Inject 8 Units into the skin at bedtime. [facility] substitute for Lantus      metoprolol  succinate (TOPROL -XL) 50 MG 24 hr tablet Take 25 mg by mouth daily. Takes half tablet  daily     Polyethyl Glycol-Propyl Glycol (SYSTANE OP) Place 1 drop into both eyes as needed (dry eye). (Patient not taking: Reported on 01/30/2024)     PRESCRIPTION MEDICATION Take 4 sprays by mouth 4 (four) times daily as needed. ARTIFICIAL SALIVA ORAL SPRAY  ([facility]S [facility])     Semaglutide, 1 MG/DOSE, 4 MG/3ML SOPN Inject 1 mg into the skin once a week. Wednesday's  (ozempic)     No current facility-administered medications on file prior to visit.  [2] No Known Allergies  "

## 2024-09-20 ENCOUNTER — Encounter: Payer: Self-pay | Admitting: Neurology

## 2024-09-20 ENCOUNTER — Ambulatory Visit (INDEPENDENT_AMBULATORY_CARE_PROVIDER_SITE_OTHER): Payer: Self-pay | Admitting: Neurology

## 2024-09-20 VITALS — BP 187/98 | HR 69 | Ht 69.0 in | Wt 189.0 lb

## 2024-09-20 DIAGNOSIS — R569 Unspecified convulsions: Secondary | ICD-10-CM

## 2024-09-20 DIAGNOSIS — I10 Essential (primary) hypertension: Secondary | ICD-10-CM

## 2024-09-20 NOTE — Patient Instructions (Addendum)
 1. Check one hour EEG  2. Avoid activities in which a seizure would cause danger to yourself or to others.  Don't operate dangerous machinery, swim alone, or climb in high or dangerous places, such as on ladders, roofs, or girders.  Do not drive unless your doctor says you may.  3. If you have any warning that you may have a seizure, lay down in a safe place where you can't hurt yourself.    4.  No driving for 6 months from last seizure, as per [place]  state law.   Please refer to the following link on the [employer]'s website for more information: http://www.epilepsyfoundation.org/answerplace/Social/driving/drivingu.cfm   5.  Maintain good sleep hygiene.  6.  Notify your neurology if you are planning pregnancy or if you become pregnant.  7.  Contact your doctor if you have any problems that may be related to the medicine you are taking.  8.  Call 911 and bring the patient back to the ED if:        A.  The seizure lasts longer than 5 minutes.       B.  The patient doesn't awaken shortly after the seizure  C.  The patient has new problems such as difficulty seeing, speaking or moving  D.  The patient was injured during the seizure  E.  The patient has a temperature over 102 F (39C)  F.  The patient vomited and now is having trouble breathing

## 2024-09-21 ENCOUNTER — Ambulatory Visit: Payer: Self-pay | Admitting: Neurology

## 2024-09-21 DIAGNOSIS — R569 Unspecified convulsions: Secondary | ICD-10-CM | POA: Diagnosis not present

## 2024-09-21 NOTE — Progress Notes (Signed)
 EEG complete and ready for review.

## 2024-09-26 NOTE — Procedures (Signed)
 ELECTROENCEPHALOGRAM REPORT  Date of Study: 09/21/2024  Patient's Name: Christian Mclaughlin MRN: [id number] Date of Birth: 01-Nov-1945   Clinical History: 79 year old male with history of pontine infarct who presents for new-onset seizure  Medications: Medications Ordered Prior to Encounter[1]   Technical Summary: A multichannel digital EEG recording measured by the international 10-20 system with electrodes applied with paste and impedances below 5000 ohms performed in our laboratory with EKG monitoring in an awake and asleep patient.  Photic stimulation was performed.  The digital EEG was referentially recorded, reformatted, and digitally filtered in a variety of bipolar and referential montages for optimal display.    Description: The patient is awake and asleep during the recording.  During maximal wakefulness, there is a symmetric, medium voltage 10 Hz posterior dominant rhythm that attenuates with eye opening.  The record is symmetric.  During drowsiness and sleep, there is an increase in theta slowing of the background.  Stage 2 sleep was seen.  Photic stimulation did not elicit any abnormalities.  There were no epileptiform discharges or electrographic seizures seen.    EKG lead was unremarkable.  Impression: This awake and asleep EEG is normal.    Clinical Correlation: A normal EEG does not exclude a clinical diagnosis of epilepsy.  If further clinical questions remain, prolonged EEG may be helpful.  Clinical correlation is advised.   Juliene Dunnings, DO     [1]  Current Outpatient Medications on File Prior to Visit  Medication Sig Dispense Refill   amLODipine  (NORVASC ) 10 MG tablet Take 10 mg by mouth daily.     atorvastatin  (LIPITOR) 40 MG tablet Take 1 tablet (40 mg total) by mouth daily at 6 PM. (Patient taking differently: Take 40 mg by mouth at bedtime.) 30 tablet 0   Calcium  Polycarbophil (FIBER) 625 MG TABS Take 2 tablets by mouth daily.     Cholecalciferol 125 MCG (5000  UT) capsule Take 125 mcg by mouth daily.     clopidogrel  (PLAVIX ) 75 MG tablet Take 1 tablet (75 mg total) by mouth daily. (Patient taking differently: Take 75 mg by mouth every evening.) 30 tablet 0   ferrous sulfate  325 (65 FE) MG EC tablet Take 325 mg by mouth daily.     finasteride  (PROSCAR ) 5 MG tablet Take 5 mg by mouth daily. (Patient not taking: Reported on 09/20/2024)     insulin  glargine-yfgn (SEMGLEE , YFGN,) 100 UNIT/ML Pen Inject 8 Units into the skin at bedtime. [facility] substitute for Lantus  (Patient taking differently: Inject 17 Units into the skin at bedtime. VA substitute for Lantus )     meloxicam (MOBIC) 15 MG tablet Take 15 mg by mouth daily.     metoprolol  succinate (TOPROL -XL) 50 MG 24 hr tablet Take 25 mg by mouth daily. Takes half tablet  daily     Polyethyl Glycol-Propyl Glycol (SYSTANE OP) Place 1 drop into both eyes as needed (dry eye). (Patient not taking: Reported on 09/20/2024)     PRESCRIPTION MEDICATION Take 4 sprays by mouth 4 (four) times daily as needed. ARTIFICIAL SALIVA ORAL SPRAY  (GETS FROM [facility])     Semaglutide, 1 MG/DOSE, 4 MG/3ML SOPN Inject 1 mg into the skin once a week. Wednesday's  (ozempic)     No current facility-administered medications on file prior to visit.

## 2024-09-27 ENCOUNTER — Ambulatory Visit: Payer: Self-pay | Admitting: Neurology

## 2025-01-19 ENCOUNTER — Ambulatory Visit: Payer: Self-pay | Admitting: Neurology
